# Patient Record
Sex: Male | Born: 1990 | Race: Black or African American | Hispanic: No | Marital: Single | Smoking: Current every day smoker
Health system: Southern US, Community
[De-identification: ages and names within clinical notes are randomized; demographics above are authoritative.]

---

## 2015-01-22 ENCOUNTER — Emergency Department (INDEPENDENT_AMBULATORY_CARE_PROVIDER_SITE_OTHER)
Admission: EM | Admit: 2015-01-22 | Discharge: 2015-01-22 | Disposition: A | Discharge status: Home / Self Care | Payer: Medicaid Other | Source: Home / Self Care | Attending: Emergency Medicine | Admitting: Emergency Medicine

## 2015-01-22 ENCOUNTER — Encounter (HOSPITAL_COMMUNITY): Payer: Self-pay | Admitting: Emergency Medicine

## 2015-01-22 DIAGNOSIS — B353 Tinea pedis: Secondary | ICD-10-CM

## 2015-01-22 DIAGNOSIS — M79674 Pain in right toe(s): Secondary | ICD-10-CM | POA: Diagnosis not present

## 2015-01-22 DIAGNOSIS — M79675 Pain in left toe(s): Secondary | ICD-10-CM | POA: Diagnosis not present

## 2015-01-22 DIAGNOSIS — L84 Corns and callosities: Secondary | ICD-10-CM | POA: Diagnosis not present

## 2015-01-22 MED ORDER — TOLNAFTATE 1 % EX AERO: INHALATION_SPRAY | CUTANEOUS | Status: DC

## 2015-01-22 MED ORDER — TERBINAFINE HCL 250 MG PO TABS: 250.0000 mg | ORAL_TABLET | Freq: Every day | ORAL | Status: DC

## 2015-01-22 NOTE — Discharge Instructions (Signed)
Corns and Calluses A thickening of the skin layer (usually over bony areas, such as toe joints) is known as a corn. Two types of corns exist: hard corns and soft corns. Calluses are painless areas of skin thickening that are caused by repeated pressure or irritation. Corns tend to affect toe joints and the skin between the toes; whereas, a callus can appear on any part of the body (especially the hands, feet, or knees).  SYMPTOMS   Corn:  Presence of a small (1/8 to 3/8 inch [3 to 10 mm in diameter]), painful bump on the side or over the joint of a toe.  Hard corns are more common on the outer portion of the little (fifth) toe at the joint.  Soft corns are more common between bony bumps (prominences), usually between the fourth and fifth toes or between the second and third toes.  Callus:  A rough, thickened area of skin that appears after repeated pressure or irritation. CAUSES  The purpose of corns and calluses is to protect an area of skin from injury caused by repeated irritation (rubbing or squeezing). The presence of pressure causes the skin cells to grow at a faster rate than the cells of unaffected areas. This leads to an overgrowth (corn or callus). As apposed to hard corns, soft corns tend to develop between toes, because there is more moisture. Soft corns are often the result of prolonged shoe wear, which leads to increased perspiration and moisture.  RISK INCREASES WITH:  Shoes that are too tight.  Occupations or sports that involve repetitive pressure on the hands (racquetball and baseball) or sudden stops on hard surfaces (track and tennis).  Sports that require the athlete to wear shoes, perspire, or wear clothing or protective gear that causes the production of heat and friction. PREVENTION  Properly fitted shoes and equipment.  Modify activities to prevent constant pressure on specific areas of skin.  If possible, wear padding over areas of skin that are exposed to  repeated pressure or irritation.  Keep the area between the toes dry (with powder or by removing shoes often).  Relieve shoe pressure by stretching the areas of the shoe that cause the pressure and or use ointments to soften leather shoes. PROGNOSIS  Corns and calluses typically subside if the activity that causes them is eliminated. Recovery may take up to 3 weeks. Recurrence is likely even with treatment if the cause is not removed.  RELATED COMPLICATIONS  If one overcompensates in an attempt to avoid pain, he or she may experience pain in other areas due to the changes in body movements (mechanics). TREATMENT  The best way to treat corns and calluses is to remove the source of pressure. Corn and callus pads may be helpful in reducing pressure on the affected skin. For soft corns, try to keep the affected area dry. If you cannot find shoes that fit properly, a shoe repair shop may be able to alter your shoes to reduce pressure. Occasionally a cushion for the bottom of the foot (metatarsal bar) worn within the shoe may relieve pressure on corns or calluses of the foot. For calluses, you may be able to peel or rub the thickened area with a pumice stone, sandstone, callus file, or with sandpaper to remove the callus; wetting the affected area may make this process more effective. Do not cut the corn or callus with a razor or knife. If the corn or callus must be removed, then a medically trained person should perform  the procedure. After peeling away the upper layers of a corn once or twice a day, it may be recommended to apply a non-prescription 5% to 10% salicylic ointment and cover the area with a bandage. It very uncommon to have the bony bumps (at toe joints) surgically removed. MEDICATION   If pain medication is necessary, nonsteroidal anti-inflammatory medications, such as aspirin and ibuprofen, or other minor pain relievers, such as acetaminophen, are often recommended. Contact your caregiver  immediately if any bleeding, stomach upset, or signs of an allergic reaction occur.  Topical salicylic ointments (5% to 10%) may be of benefit.  Prescription pain medications may be given by a caregiver. Use only as directed and only as much as you need.  Soak the foot for 20 minutes, twice a day, in a gallon of warm water. This may help to soften corns and calluses. Care should be taken to thoroughly dry the foot, especially between the toes, after soaking. SEEK MEDICAL CARE IF:   Symptoms get worse or do not improve in 2 weeks despite treatment.  Any signs of infection develop, including redness, swelling, increased pain or tenderness, or increased warmth around the corn or callus.  New, unexplained symptoms develop (drugs used in treatment may produce side effects). Document Released: 09/29/2005 Document Revised: 12/22/2011 Document Reviewed: 01/11/2009 [facility] Patient Information 2015 [place], [place]. This information is not intended to replace advice given to you by your health care provider. Make sure you discuss any questions you have with your health care provider.  Athlete's Foot Athlete's foot (tinea pedis) is a fungal infection of the skin on the feet. It often occurs on the skin between the toes or underneath the toes. It can also occur on the soles of the feet. Athlete's foot is more likely to occur in hot, humid weather. Not washing your feet or changing your socks often enough can contribute to athlete's foot. The infection can spread from person to person (contagious). CAUSES Athlete's foot is caused by a fungus. This fungus thrives in warm, moist places. Most people get athlete's foot by sharing shower stalls, towels, and wet floors with an infected person. People with weakened immune systems, including those with diabetes, may be more likely to get athlete's foot. SYMPTOMS   Itchy areas between the toes or on the soles of the feet.  White, flaky, or scaly areas between the  toes or on the soles of the feet.  Tiny, intensely itchy blisters between the toes or on the soles of the feet.  Tiny cuts on the skin. These cuts can develop a bacterial infection.  Thick or discolored toenails. DIAGNOSIS  Your caregiver can usually tell what the problem is by doing a physical exam. Your caregiver may also take a skin sample from the rash area. The skin sample may be examined under a microscope, or it may be tested to see if fungus will grow in the sample. A sample may also be taken from your toenail for testing. TREATMENT  Over-the-counter and prescription medicines can be used to kill the fungus. These medicines are available as powders or creams. Your caregiver can suggest medicines for you. Fungal infections respond slowly to treatment. You may need to continue using your medicine for several weeks. PREVENTION   Do not share towels.  Wear sandals in wet areas, such as shared locker rooms and shared showers.  Keep your feet dry. Wear shoes that allow air to circulate. Wear cotton or wool socks. HOME CARE INSTRUCTIONS   Take  medicines as directed by your caregiver. Do not use steroid creams on athlete's foot.  Keep your feet clean and cool. Wash your feet daily and dry them thoroughly, especially between your toes.  Change your socks every day. Wear cotton or wool socks. In hot climates, you may need to change your socks 2 to 3 times per day.  Wear sandals or canvas tennis shoes with good air circulation.  If you have blisters, soak your feet in Burow's solution or Epsom salts for 20 to 30 minutes, 2 times a day to dry out the blisters. Make sure you dry your feet thoroughly afterward. SEEK MEDICAL CARE IF:   You have a fever.  You have swelling, soreness, warmth, or redness in your foot.  You are not getting better after 7 days of treatment.  You are not completely cured after 30 days.  You have any problems caused by your medicines. MAKE SURE YOU:    Understand these instructions.  Will watch your condition.  Will get help right away if you are not doing well or get worse. Document Released: 09/26/2000 Document Revised: 12/22/2011 Document Reviewed: 07/18/2011 [facility] Patient Information 2015 [place], [place]. This information is not intended to replace advice given to you by your health care provider. Make sure you discuss any questions you have with your health care provider.

## 2015-01-22 NOTE — ED Provider Notes (Signed)
CSN: [id number]     Arrival date & time 01/22/15  1251 History   First MD Initiated Contact with Patient 01/22/15 1426     Chief Complaint  Patient presents with  . Toe Pain   (Consider location/radiation/quality/duration/timing/severity/associated sxs/prior Treatment) HPI     24 year old male presents for evaluation of toe pain. He has had pain on the outside of both fifth toes for about a month, getting worse in the past week. He has tried changing shoes but that has not helped. He denies any swelling or redness. Denies any injury.  History reviewed. No pertinent past medical history. History reviewed. No pertinent past surgical history. History reviewed. No pertinent family history. History  Substance Use Topics  . Smoking status: Current Every Day Smoker    Types: Cigarettes  . Smokeless tobacco: Not on file  . Alcohol Use: Yes    Review of Systems  Musculoskeletal:       Pain in the outside of the both toes  All other systems reviewed and are negative.   Allergies  Review of patient's allergies indicates no known allergies.  Home Medications   Prior to Admission medications   Medication Sig Start Date End Date Taking? Authorizing Provider  terbinafine (LAMISIL) 250 MG tablet Take 1 tablet (250 mg total) by mouth daily. 01/22/15   Adrian BlackwaterZachary H Nala Kachel, PA-C  Tolnaftate (TINACTIN) 1 % AERO Spray onto toes twice daily 01/22/15   Graylon GoodZachary H Javaun Dimperio, PA-C   BP 118/71 mmHg  Pulse 58  Temp(Src) 97.9 F (36.6 C) (Oral)  Resp 16  SpO2 100% Physical Exam  Constitutional: He is oriented to person, place, and time. He appears well-developed and well-nourished. No distress.  HENT:  Head: Normocephalic.  Pulmonary/Chest: Effort normal. No respiratory distress.  Neurological: He is alert and oriented to person, place, and time. Coordination normal.  Skin: Skin is warm and dry. No rash noted. He is not diaphoretic.  Skin between the fourth and fifth digits of the toes is thickened,  white, macerated; there is a thickened callus on the lateral portion of both fifth toes  Psychiatric: He has a normal mood and affect. Judgment normal.  Nursing note and vitals reviewed.   ED Course  Procedures (including critical care time) Labs Review Labs Reviewed - No data to display  Imaging Review No results found.   MDM   1. Toe pain, bilateral   2. Pre-ulcerative corn or callous   3. Tinea pedis of both feet    He has tinea pedis as well as corns. Use terbinafine and Tinactin spray as well as corn pads and follow-up with podiatry   Meds ordered this encounter  Medications  . terbinafine (LAMISIL) 250 MG tablet    Sig: Take 1 tablet (250 mg total) by mouth daily.    Dispense:  14 tablet    Refill:  0  . Tolnaftate (TINACTIN) 1 % AERO    Sig: Spray onto toes twice daily    Dispense:  1 Can    Refill:  0       Graylon GoodZachary H Sherrel Ploch, PA-C 01/22/15 1505

## 2015-01-22 NOTE — ED Notes (Signed)
Pt states that his toes on both feet hurt he is not sure what the cause of the pain is coming from.

## 2015-02-16 ENCOUNTER — Ambulatory Visit: Payer: Medicaid Other | Admitting: Podiatry

## 2015-04-09 ENCOUNTER — Ambulatory Visit (INDEPENDENT_AMBULATORY_CARE_PROVIDER_SITE_OTHER): Payer: Medicaid Other | Admitting: Family Medicine

## 2015-04-09 VITALS — BP 126/70 | HR 64 | Temp 98.2°F | Ht 71.5 in | Wt 143.2 lb

## 2015-04-09 DIAGNOSIS — L84 Corns and callosities: Secondary | ICD-10-CM | POA: Diagnosis not present

## 2015-04-09 DIAGNOSIS — Z008 Encounter for other general examination: Secondary | ICD-10-CM

## 2015-04-09 DIAGNOSIS — Z654 Victim of crime and terrorism: Secondary | ICD-10-CM

## 2015-04-09 DIAGNOSIS — K088 Other specified disorders of teeth and supporting structures: Secondary | ICD-10-CM | POA: Diagnosis present

## 2015-04-09 DIAGNOSIS — K089 Disorder of teeth and supporting structures, unspecified: Secondary | ICD-10-CM

## 2015-04-09 DIAGNOSIS — Z0289 Encounter for other administrative examinations: Secondary | ICD-10-CM

## 2015-04-09 NOTE — Assessment & Plan Note (Signed)
Patient is recent refugee to [place]. Reports issues that appear consistent with PTSD and drinking habits consistent with possible alcoholism. Will plan to discuss further at follow-u visit and evaluate with RHS-15 and CAGE questionairres.

## 2015-04-09 NOTE — Patient Instructions (Addendum)
Nice to meet you. We would like for you to obtain Dr Oletta CohnSchol's corn pads for your toes. Please apply these to your small toes in the area of thick skin. We will refer you to a dentist.  If you develop fever, worsening tooth pain, pain in your toes, redness, or drainage from toes please seek medical attention.
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
## 2015-04-09 NOTE — Assessment & Plan Note (Signed)
Patient with evidence of bilateral callus formation on 5th toes. No evidence of infection on exam. No pain today. Discussed obtaining Dr Oletta CohnSchol's corn pads from the pharmacy to help pad his toes. Also discussed different shoes with the patient. Given return precautions. Will see back in one month for follow-up.

## 2015-04-09 NOTE — Assessment & Plan Note (Signed)
Patient notes dental pain in areas where there are gaps in his teeth. Also has staining of inner aspect of teeth. No signs of infection today and no pain today. Will refer to dentist. Given return precautions.

## 2015-04-11 DIAGNOSIS — Z654 Victim of crime and terrorism: Secondary | ICD-10-CM | POA: Insufficient documentation

## 2015-05-08 ENCOUNTER — Ambulatory Visit: Payer: Medicaid Other | Admitting: Family Medicine

## 2015-07-26 ENCOUNTER — Emergency Department (HOSPITAL_COMMUNITY): Payer: BLUE CROSS/BLUE SHIELD

## 2015-07-26 ENCOUNTER — Emergency Department (HOSPITAL_COMMUNITY)
Admission: EM | Admit: 2015-07-26 | Discharge: 2015-07-26 | Disposition: A | Discharge status: Home / Self Care | Payer: BLUE CROSS/BLUE SHIELD | Attending: Emergency Medicine | Admitting: Emergency Medicine

## 2015-07-26 ENCOUNTER — Encounter (HOSPITAL_COMMUNITY): Payer: Self-pay | Admitting: Emergency Medicine

## 2015-07-26 DIAGNOSIS — Z79899 Other long term (current) drug therapy: Secondary | ICD-10-CM | POA: Insufficient documentation

## 2015-07-26 DIAGNOSIS — Z720 Tobacco use: Secondary | ICD-10-CM | POA: Insufficient documentation

## 2015-07-26 DIAGNOSIS — R519 Headache, unspecified: Secondary | ICD-10-CM

## 2015-07-26 DIAGNOSIS — R6883 Chills (without fever): Secondary | ICD-10-CM | POA: Insufficient documentation

## 2015-07-26 DIAGNOSIS — R51 Headache: Secondary | ICD-10-CM | POA: Diagnosis present

## 2015-07-26 MED ORDER — DIPHENHYDRAMINE HCL 50 MG/ML IJ SOLN
25.0000 mg | Freq: Once | INTRAMUSCULAR | Status: AC
  Administered 2015-07-26: 25 mg via INTRAVENOUS
  Filled 2015-07-26: qty 1

## 2015-07-26 MED ORDER — KETOROLAC TROMETHAMINE 30 MG/ML IJ SOLN
30.0000 mg | Freq: Once | INTRAMUSCULAR | Status: AC
  Administered 2015-07-26: 30 mg via INTRAVENOUS
  Filled 2015-07-26: qty 1

## 2015-07-26 MED ORDER — METOCLOPRAMIDE HCL 5 MG/ML IJ SOLN
10.0000 mg | Freq: Once | INTRAMUSCULAR | Status: AC
  Administered 2015-07-26: 10 mg via INTRAVENOUS
  Filled 2015-07-26: qty 2

## 2015-07-26 MED ORDER — SODIUM CHLORIDE 0.9 % IV BOLUS (SEPSIS)
1000.0000 mL | Freq: Once | INTRAVENOUS | Status: AC
  Administered 2015-07-26: 1000 mL via INTRAVENOUS

## 2015-07-26 NOTE — ED Notes (Signed)
Back from CT

## 2015-07-26 NOTE — ED Notes (Signed)
States his headache is better however he isn't able to rate pain

## 2015-07-26 NOTE — ED Provider Notes (Signed)
CSN: [id number]     Arrival date & time 07/26/15  5284 History   First MD Initiated Contact with Patient 07/26/15 [id number]     Chief Complaint  Patient presents with  . Headache     (Consider location/radiation/quality/duration/timing/severity/associated sxs/prior Treatment) Patient is a 24 y.o. male presenting with headaches. The history is provided by the patient.  Headache Pain location:  Frontal Quality:  Dull Radiates to:  Does not radiate Onset quality:  Gradual Timing:  Constant Progression:  Unchanged Chronicity:  New Relieved by:  Nothing Worsened by:  Nothing Associated symptoms: no fever     History reviewed. No pertinent past medical history. History reviewed. No pertinent past surgical history. No family history on file. Social History  Substance Use Topics  . Smoking status: Current Every Day Smoker    Types: Cigarettes  . Smokeless tobacco: None  . Alcohol Use: Yes    Review of Systems  Constitutional: Positive for chills. Negative for fever.  Neurological: Positive for headaches.  All other systems reviewed and are negative.     Allergies  Review of patient's allergies indicates no known allergies.  Home Medications   Prior to Admission medications   Medication Sig Start Date End Date Taking? Authorizing Provider  terbinafine (LAMISIL) 250 MG tablet Take 1 tablet (250 mg total) by mouth daily. 01/22/15   Graylon Good, PA-C  Tolnaftate (TINACTIN) 1 % AERO Spray onto toes twice daily 01/22/15   Graylon Good, PA-C   BP 129/63 mmHg  Pulse 69  Temp(Src) 98.1 F (36.7 C) (Oral)  Resp 14  Ht  (1.549 m)  Wt 140 lb (63.504 kg)  BMI 26.47 kg/m2  SpO2 100% Physical Exam  Constitutional: He is oriented to person, place, and time. He appears well-developed and well-nourished. No distress.  HENT:  Head: Normocephalic and atraumatic.  Mouth/Throat: Oropharynx is clear and moist. No oropharyngeal exudate.  Eyes: EOM are normal. Pupils are  equal, round, and reactive to light.  Neck: Normal range of motion. Neck supple.  Cardiovascular: Normal rate and regular rhythm.  Exam reveals no friction rub.   No murmur heard. Pulmonary/Chest: Effort normal and breath sounds normal. No respiratory distress. He has no wheezes. He has no rales.  Abdominal: Soft. He exhibits no distension. There is no tenderness. There is no rebound.  Musculoskeletal: Normal range of motion. He exhibits no edema.  Neurological: He is alert and oriented to person, place, and time. No cranial nerve deficit. He exhibits normal muscle tone. Coordination normal.  Skin: No rash noted. He is not diaphoretic.  Nursing note and vitals reviewed.   ED Course  Procedures (including critical care time) Labs Review Labs Reviewed - No data to display  Imaging Review Ct Head Wo Contrast  07/26/2015  CLINICAL DATA:  Headache starting yesterday EXAM: CT HEAD WITHOUT CONTRAST TECHNIQUE: Contiguous axial images were obtained from the base of the skull through the vertex without intravenous contrast. COMPARISON:  None. FINDINGS: No skull fracture is noted. Paranasal sinuses and mastoid air cells are unremarkable. No intracranial hemorrhage, mass effect or midline shift. No acute cortical infarction. No mass lesion is noted on this unenhanced scan. No hydrocephalus. The gray and white-matter differentiation is preserved. IMPRESSION: No acute intracranial abnormality. Electronically Signed   By: Natasha Mead M.D.   On: 07/26/2015 11:41   I have personally reviewed and evaluated these images and lab results as part of my medical decision-making.   EKG Interpretation None  MDM   Final diagnoses:  Acute nonintractable headache, unspecified headache type    23M here with headache and chills. Began last night. No history of headaches. No fever documents. Denies vision change, sore throat, rhinorrhea, ear pain, chest pain, SOB. No N/V. Headache diffuse, aching. Denies  myalgias, but feels diffusely weak.  Neuro exam benign, well-appearing, no concern for meningitis. Will give headache cocktail and scan his head. Toradol, reglan, benadryl given.  CT ok. Feeling better after medicines. Relaxing comfortably. No fever, well appearing, ambulating. He is stable for discharge.    Elwin MochaBlair Cayla Wiegand, MD 07/26/15 1556

## 2015-07-26 NOTE — Discharge Instructions (Signed)

## 2015-07-26 NOTE — ED Notes (Signed)
Onset one day ago while at work felt cold put a coat on and developed a headache. Pain currently 5/10 throbbing. Alert following commands appropriate.

## 2015-12-14 ENCOUNTER — Emergency Department (HOSPITAL_COMMUNITY)
Admission: EM | Admit: 2015-12-14 | Discharge: 2015-12-14 | Disposition: A | Discharge status: Home / Self Care | Payer: BLUE CROSS/BLUE SHIELD | Source: Home / Self Care | Attending: Family Medicine | Admitting: Family Medicine

## 2015-12-14 ENCOUNTER — Encounter (HOSPITAL_COMMUNITY): Payer: Self-pay | Admitting: Emergency Medicine

## 2015-12-14 DIAGNOSIS — J029 Acute pharyngitis, unspecified: Secondary | ICD-10-CM

## 2015-12-14 DIAGNOSIS — B349 Viral infection, unspecified: Secondary | ICD-10-CM

## 2015-12-14 DIAGNOSIS — R5381 Other malaise: Secondary | ICD-10-CM

## 2015-12-14 DIAGNOSIS — R5383 Other fatigue: Secondary | ICD-10-CM | POA: Diagnosis not present

## 2015-12-14 LAB — POCT INFECTIOUS MONO SCREEN: MONO SCREEN: NEGATIVE

## 2015-12-14 NOTE — ED Notes (Signed)
C/o cold sx onset yest associated w/fevers, prod cough, runny nose and congestion... No taking any meds... A&O x4... No acute distress.

## 2015-12-14 NOTE — Discharge Instructions (Signed)
It is a pleasure to see you today.  I believe your symptoms are caused by a viral respiratory illness.   I recommend taking Tylenol 650mg  tablets by mouth every 4 to 6 hours, alternating with ibuprofen 400-800mg  by mouth every 6-8 hours.   [facility]

## 2015-12-14 NOTE — ED Provider Notes (Addendum)
CSN: [id number]     Arrival date & time 12/14/15  1647 History   First MD Initiated Contact with Patient 12/14/15 1914     Chief Complaint  Patient presents with  . URI   (Consider location/radiation/quality/duration/timing/severity/associated sxs/prior Treatment) Patient is a 25 y.o. male presenting with URI. The history is provided by the patient. No language interpreter was used.  URI Presenting symptoms: fatigue, fever and sore throat   Presenting symptoms: no congestion, no cough and no rhinorrhea   Associated symptoms: no sneezing   Patient presents with complaint of sore throat, subjective fevers/chills and malaise, beginning yesterday evening at 7pm. Began with some nausea but no vomiting or diarrhea; his primary complaint has been a very bad sore throat. Denies cough or significant rhinorrhea.  Has not taken any medications for this episode of illness.   Patient received flu vaccine this season.  He is concerned about having the flu, because a close friend was diagnosed with flu earlier today.    Patient smokes 4-6 cigarettes a day.  Interested in quitting.   NKDA.  Takes no chronic medications.   History reviewed. No pertinent past medical history. History reviewed. No pertinent past surgical history. No family history on file. Social History  Substance Use Topics  . Smoking status: Current Every Day Smoker    Types: Cigarettes  . Smokeless tobacco: None  . Alcohol Use: Yes    Review of Systems  Constitutional: Positive for fever, chills, diaphoresis and fatigue.  HENT: Positive for sore throat. Negative for congestion, drooling, ear discharge, postnasal drip, rhinorrhea, sinus pressure and sneezing.   Respiratory: Negative for apnea, cough, chest tightness and shortness of breath.   Cardiovascular: Negative for chest pain.  Gastrointestinal: Positive for nausea. Negative for vomiting, abdominal pain and diarrhea.    Allergies  Review of patient's allergies  indicates no known allergies.  Home Medications   Prior to Admission medications   Medication Sig Start Date End Date Taking? Authorizing Provider  terbinafine (LAMISIL) 250 MG tablet Take 1 tablet (250 mg total) by mouth daily. 01/22/15   Adrian Blackwater Baker, PA-C  Tolnaftate (TINACTIN) 1 % AERO Spray onto toes twice daily 01/22/15   Graylon Good, PA-C   Meds Ordered and Administered this Visit  Medications - No data to display  BP 122/67 mmHg  Pulse 64  Temp(Src) 98 F (36.7 C) (Oral)  Resp 16  SpO2 100% No data found.   Physical Exam  Constitutional: He appears well-developed and well-nourished. No distress.  HENT:  Head: Normocephalic and atraumatic.  Right Ear: External ear normal.  Left Ear: External ear normal.  Nose: Nose normal.  Mouth/Throat: Oropharynx is clear and moist.  Injected nasal mucosa; No frontal or maxillary sinus tenderness Cobblestoning oropharynx without exudate.   Eyes: Conjunctivae and EOM are normal. Right eye exhibits no discharge. Left eye exhibits no discharge.  Neck: Normal range of motion. Neck supple. No thyromegaly present.  Some shotty anterior cervical adenopathy; no posterior cervical adenopathy.   Cardiovascular: Normal rate and regular rhythm.   Pulmonary/Chest: Effort normal and breath sounds normal. No respiratory distress. He has no wheezes. He has no rales. He exhibits no tenderness.  Abdominal: Soft. He exhibits no distension and no mass.  No splenomegaly noted on palpation  Lymphadenopathy:    He has cervical adenopathy.  Skin: He is not diaphoretic.    ED Course  Procedures (including critical care time)  Labs Review Labs Reviewed - No data to display  Imaging  Review No results found.   Visual Acuity Review  Right Eye Distance:   Left Eye Distance:   Bilateral Distance:    Right Eye Near:   Left Eye Near:    Bilateral Near:         MDM  No diagnosis found. Patient with pharyngitis, likely viral acute  etiology.  Consideration given to infectious mononucleosis; to check monospot test: negative.  Conservative supportive therapy, and return if worsening.   Paula ComptonJames Rmani Kapusta, MD    Barbaraann BarthelJames O Devonia Farro, MD 12/14/15 Serena Croissant1930  Barbaraann BarthelJames O Amala Petion, MD 12/14/15 [phone]

## 2016-04-14 ENCOUNTER — Encounter (HOSPITAL_COMMUNITY): Payer: Self-pay | Admitting: Emergency Medicine

## 2016-04-14 ENCOUNTER — Ambulatory Visit (HOSPITAL_COMMUNITY)
Admission: EM | Admit: 2016-04-14 | Discharge: 2016-04-14 | Disposition: A | Discharge status: Home / Self Care | Payer: BLUE CROSS/BLUE SHIELD | Attending: Family Medicine | Admitting: Family Medicine

## 2016-04-14 DIAGNOSIS — E860 Dehydration: Secondary | ICD-10-CM

## 2016-04-14 LAB — POCT I-STAT, CHEM 8
BUN: 5 mg/dL — ABNORMAL LOW (ref 6–20)
CREATININE: 0.8 mg/dL (ref 0.61–1.24)
Calcium, Ion: 1.17 mmol/L (ref 1.13–1.30)
Chloride: 100 mmol/L — ABNORMAL LOW (ref 101–111)
Glucose, Bld: 108 mg/dL — ABNORMAL HIGH (ref 65–99)
HCT: 57 % — ABNORMAL HIGH (ref 39.0–52.0)
Hemoglobin: 19.4 g/dL — ABNORMAL HIGH (ref 13.0–17.0)
Potassium: 3.7 mmol/L (ref 3.5–5.1)
SODIUM: 140 mmol/L (ref 135–145)
TCO2: 26 mmol/L (ref 0–100)

## 2016-04-14 LAB — POCT URINALYSIS DIP (DEVICE)
BILIRUBIN URINE: NEGATIVE
GLUCOSE, UA: NEGATIVE mg/dL
Hgb urine dipstick: NEGATIVE
Ketones, ur: NEGATIVE mg/dL
Leukocytes, UA: NEGATIVE
NITRITE: NEGATIVE
Protein, ur: >=300 mg/dL — AB
Specific Gravity, Urine: 1.015 (ref 1.005–1.030)
UROBILINOGEN UA: 1 mg/dL (ref 0.0–1.0)
pH: 8.5 — ABNORMAL HIGH (ref 5.0–8.0)

## 2016-04-14 LAB — POCT INFECTIOUS MONO SCREEN: Mono Screen: NEGATIVE

## 2016-04-14 MED ORDER — SODIUM CHLORIDE 0.9 % IV SOLN
Freq: Once | INTRAVENOUS | Status: AC
  Administered 2016-04-14: 19:00:00 via INTRAVENOUS

## 2016-04-14 NOTE — ED Provider Notes (Signed)
CSN: [id number]     Arrival date & time 04/14/16  1556 History   First MD Initiated Contact with Patient 04/14/16 1631     Chief Complaint  Patient presents with  . Headache  . Fatigue   (Consider location/radiation/quality/duration/timing/severity/associated sxs/prior Treatment) HPI History obtained from patient: Location:  Body, head Context/Duration: Patient states he started feeling weak with a headache yesterday. Symptoms got worse while working. Patient states that he has not been drinking a great deal of fluids.  Severity: No pain   Quality: Weakness Timing:        Constant    Home Treatment: None Associated symptoms: Weakness  Family History: None    History reviewed. No pertinent past medical history. History reviewed. No pertinent past surgical history. History reviewed. No pertinent family history. Social History  Substance Use Topics  . Smoking status: Current Every Day Smoker    Types: Cigarettes  . Smokeless tobacco: None  . Alcohol Use: Yes    Review of Systems  Denies:  NAUSEA, ABDOMINAL PAIN, CHEST PAIN, CONGESTION, DYSURIA, SHORTNESS OF BREATH  Allergies  Review of patient's allergies indicates no known allergies.  Home Medications   Prior to Admission medications   Medication Sig Start Date End Date Taking? Authorizing Provider  terbinafine (LAMISIL) 250 MG tablet Take 1 tablet (250 mg total) by mouth daily. 01/22/15   Adrian BlackwaterZachary H Baker, PA-C  Tolnaftate (TINACTIN) 1 % AERO Spray onto toes twice daily 01/22/15   Graylon GoodZachary H Baker, PA-C   Meds Ordered and Administered this Visit   Medications  0.9 %  sodium chloride infusion ( Intravenous New Bag/Given 04/14/16 1837)    BP 127/86 mmHg  Pulse 74  Temp(Src) 98.6 F (37 C) (Oral)  Resp 20  SpO2 100% No data found.   Physical Exam NURSES NOTES AND VITAL SIGNS REVIEWED. CONSTITUTIONAL: Well developed, well nourished, no acute distress HEENT: normocephalic, atraumatic EYES: Conjunctiva  normal NECK:normal ROM, supple, no adenopathy PULMONARY:No respiratory distress, normal effort ABDOMINAL: Soft, ND, NT BS+, No CVAT MUSCULOSKELETAL: Normal ROM of all extremities,  SKIN: warm and dry without rash PSYCHIATRIC: Mood and affect, behavior are normal  ED Course  Procedures (including critical care time)  Labs Review Labs Reviewed  POCT URINALYSIS DIP (DEVICE) - Abnormal; Notable for the following:    pH 8.5 (*)    Protein, ur >=300 (*)    All other components within normal limits  POCT I-STAT, CHEM 8 - Abnormal; Notable for the following:    Chloride 100 (*)    BUN 5 (*)    Glucose, Bld 108 (*)    Hemoglobin 19.4 (*)    HCT 57.0 (*)    All other components within normal limits  POCT INFECTIOUS MONO SCREEN   Laboratory data has been discussed with patient has used as part as the MDM Imaging Review No results found.   Visual Acuity Review  Right Eye Distance:   Left Eye Distance:   Bilateral Distance:    Right Eye Near:   Left Eye Near:    Bilateral Near:      Patient was given a liter of IV fluids and states he feels much better.   MDM   1. Dehydration     Patient is reassured that there are no issues that require transfer to higher level of care at this time or additional tests. Patient is advised to continue home symptomatic treatment. Patient is advised that if there are new or worsening symptoms to attend the emergency  department, contact primary care provider, or return to UC. Instructions of care provided discharged home in stable condition.    THIS NOTE WAS GENERATED USING A VOICE RECOGNITION SOFTWARE PROGRAM. ALL REASONABLE EFFORTS  WERE MADE TO PROOFREAD THIS DOCUMENT FOR ACCURACY.  I have verbally reviewed the discharge instructions with the patient. A printed AVS was given to the patient.  All questions were answered prior to discharge.      Tharon AquasFrank C Allard Lightsey, PA 04/14/16 [phone]

## 2016-04-14 NOTE — Discharge Instructions (Signed)

## 2016-04-14 NOTE — ED Notes (Signed)
[facility] with a [facility] of a headache and weakness that started 1 day ago.

## 2016-12-22 ENCOUNTER — Ambulatory Visit (HOSPITAL_COMMUNITY)
Admission: EM | Admit: 2016-12-22 | Discharge: 2016-12-22 | Disposition: A | Discharge status: Home / Self Care | Payer: BLUE CROSS/BLUE SHIELD | Attending: Emergency Medicine | Admitting: Emergency Medicine

## 2016-12-22 ENCOUNTER — Encounter (HOSPITAL_COMMUNITY): Payer: Self-pay | Admitting: *Deleted

## 2016-12-22 DIAGNOSIS — H9202 Otalgia, left ear: Secondary | ICD-10-CM

## 2016-12-22 NOTE — ED Provider Notes (Signed)
CSN: [id number]     Arrival date & time 12/22/16  1502 History   First MD Initiated Contact with Patient 12/22/16 1644     Chief Complaint  Patient presents with  . Otalgia   (Consider location/radiation/quality/duration/timing/severity/associated sxs/prior Treatment) 26 year old male presents to clinic with a 2 day history of left ear pain. Fever, congestion, he denies any loss of hearing, dizziness, tinnitus, cough, shortness of breath, URI type symptoms, chest pain or pressure, wheezing, nausea, vomiting, diarrhea, or abdominal pain. Has not taken any medications over-the-counter for his symptoms. He denies history of smoking, or recreational drug use.   The history is provided by the patient.  Otalgia    History reviewed. No pertinent past medical history. History reviewed. No pertinent surgical history. History reviewed. No pertinent family history. Social History  Substance Use Topics  . Smoking status: Current Every Day Smoker    Types: Cigarettes  . Smokeless tobacco: Not on file  . Alcohol use Yes    Review of Systems  Reason unable to perform ROS: As covered in history of present illness.  HENT: Positive for ear pain.   All other systems reviewed and are negative.   Allergies  Patient has no known allergies.  Home Medications   Prior to Admission medications   Not on File   Meds Ordered and Administered this Visit  Medications - No data to display  BP 110/72 (BP Location: Right Arm)   Pulse 72   Temp 98.6 F (37 C) (Oral)   Resp 18   SpO2 98%  No data found.   Physical Exam  Constitutional: He is oriented to person, place, and time. He appears well-developed and well-nourished. No distress.  HENT:  Head: Normocephalic and atraumatic.  Right Ear: Tympanic membrane and external ear normal.  Left Ear: Tympanic membrane and external ear normal.  Nose: Nose normal. Right sinus exhibits no maxillary sinus tenderness and no frontal sinus tenderness. Left  sinus exhibits no maxillary sinus tenderness and no frontal sinus tenderness.  Mouth/Throat: Uvula is midline and oropharynx is clear and moist. No oropharyngeal exudate.  Eyes: Conjunctivae are normal. Pupils are equal, round, and reactive to light.  Neck: Normal range of motion. Neck supple. No JVD present.  Cardiovascular: Normal rate and regular rhythm.   Pulmonary/Chest: Effort normal and breath sounds normal. No respiratory distress. He has no wheezes.  Abdominal: Soft. Bowel sounds are normal.  Musculoskeletal: He exhibits no edema or tenderness.  Lymphadenopathy:       Head (right side): No submental, no submandibular, no tonsillar and no preauricular adenopathy present.       Head (left side): No submental, no submandibular, no tonsillar and no preauricular adenopathy present.    He has no cervical adenopathy.  Neurological: He is alert and oriented to person, place, and time.  Skin: Skin is warm and dry. Capillary refill takes less than 2 seconds. No rash noted. He is not diaphoretic. No erythema.  Psychiatric: He has a normal mood and affect.  Nursing note and vitals reviewed.   Urgent Care Course     Procedures (including critical care time)  Labs Review Labs Reviewed - No data to display  Imaging Review No results found.     MDM   1. Otalgia of left ear    Your ear is not infected, and there is no trauma within the canal. I recommend taking a daily antihistamine such as Claritin, or Allegra, and using Flonase nasal spray 2 sprays each nostril once  a day. Should your pain persist I recommend you follow-up with your primary care provider or return to clinic as needed.      Dorena BodoLawrence Merwyn Hodapp, NP 12/22/16 1702

## 2016-12-22 NOTE — Discharge Instructions (Signed)
Your ear is not infected, and there is no trauma within the canal. I recommend taking a daily antihistamine such as Claritin, or Allegra, and using Flonase nasal spray 2 sprays each nostril once a day. Should your pain persist I recommend you follow-up with your primary care provider or return to clinic as needed.

## 2016-12-22 NOTE — ED Triage Notes (Signed)
Pt  Thinks  He  May  Have  A  Small  Sore  Inside  His  l ear  He  Reports  The  Pain  Started  Several  Days  Ago       denys  Any  Other  Symptoms   Sitting  Upright on the  Exam table  Speaking in  Complete  sentances  And  Is  In no  Acute  Distress

## 2017-01-22 ENCOUNTER — Ambulatory Visit (HOSPITAL_COMMUNITY)
Admission: EM | Admit: 2017-01-22 | Discharge: 2017-01-22 | Disposition: A | Discharge status: Home / Self Care | Payer: BLUE CROSS/BLUE SHIELD | Attending: Family Medicine | Admitting: Family Medicine

## 2017-01-22 ENCOUNTER — Encounter (HOSPITAL_COMMUNITY): Payer: Self-pay | Admitting: Emergency Medicine

## 2017-01-22 DIAGNOSIS — T700XXA Otitic barotrauma, initial encounter: Secondary | ICD-10-CM

## 2017-01-22 DIAGNOSIS — H9203 Otalgia, bilateral: Secondary | ICD-10-CM

## 2017-01-22 DIAGNOSIS — H6983 Other specified disorders of Eustachian tube, bilateral: Secondary | ICD-10-CM

## 2017-01-22 DIAGNOSIS — R0982 Postnasal drip: Secondary | ICD-10-CM | POA: Diagnosis not present

## 2017-01-22 NOTE — ED Provider Notes (Signed)
CSN: [id number]     Arrival date & time 01/22/17  1337 History   First MD Initiated Contact with Patient 01/22/17 1400     Chief Complaint  Patient presents with  . Otalgia   (Consider location/radiation/quality/duration/timing/severity/associated sxs/prior Treatment) Pleasant 26 year old African immigrant male complaining of bilateral ear pain left greater than right. Denies other symptoms except for the fact that sometimes he gets weak and experiencing the pain. Denies fever or chills.      History reviewed. No pertinent past medical history. History reviewed. No pertinent surgical history. History reviewed. No pertinent family history. Social History  Substance Use Topics  . Smoking status: Current Every Day Smoker    Types: Cigarettes  . Smokeless tobacco: Never Used  . Alcohol use Yes    Review of Systems  Constitutional: Negative for activity change, chills and fever.  HENT: Positive for ear pain, postnasal drip, rhinorrhea and sinus pain. Negative for sore throat and tinnitus.   Eyes: Negative.   Respiratory: Negative.   Cardiovascular: Negative.   Neurological: Negative.   All other systems reviewed and are negative.   Allergies  Patient has no known allergies.  Home Medications   Prior to Admission medications   Not on File   Meds Ordered and Administered this Visit  Medications - No data to display  BP 115/76 (BP Location: Left Arm)   Pulse 85   Temp 97.4 F (36.3 C) (Oral)   Resp 16   SpO2 98%  No data found.   Physical Exam  Constitutional: He is oriented to person, place, and time. He appears well-developed and well-nourished. No distress.  HENT:  Head: Normocephalic and atraumatic.  Mouth/Throat: No oropharyngeal exudate.  Bilateral TMs are pearly gray, translucent. No erythema or bulging. Both TMs have retraction.  Oropharynx with minor erythema and moderate amount of clear PND.  Eyes: EOM are normal.  Neck: Normal range of motion. Neck  supple.  Cardiovascular: Normal rate, regular rhythm and normal heart sounds.   Pulmonary/Chest: Effort normal and breath sounds normal.  Abdominal: Soft.  Musculoskeletal: Normal range of motion. He exhibits no edema.  Lymphadenopathy:    He has no cervical adenopathy.  Neurological: He is alert and oriented to person, place, and time.  Skin: Skin is warm and dry.  Psychiatric: He has a normal mood and affect.  Nursing note and vitals reviewed.   Urgent Care Course     Procedures (including critical care time)  Labs Review Labs Reviewed - No data to display  Imaging Review No results found.   Visual Acuity Review  Right Eye Distance:   Left Eye Distance:   Bilateral Distance:    Right Eye Near:   Left Eye Near:    Bilateral Near:         MDM   1. ETD (Eustachian tube dysfunction), bilateral   2. Barotitis media, initial encounter   3. Otalgia of both ears   4. PND (post-nasal drip)    You have drainage down the back of your throat which is likely affecting her eustachian tubes. The purpose for the tubes is to visualize pressuring her ear. Right now we are not functioning normally because of the drainage. This will get better. Medicines that can help or antihistamine such as Allegra or Zyrtec taken daily and sometimes Sudafed PE 10 mg every 4-6 hours. Drink plenty of cool liquids and keep your throat clear.     Hayden Rasmussen, NP 01/22/17 [phone]

## 2017-01-22 NOTE — Discharge Instructions (Signed)
You have drainage down the back of your throat which is likely affecting her eustachian tubes. The purpose for the tubes is to visualize pressuring her ear. Right now we are not functioning normally because of the drainage. This will get better. Medicines that can help or antihistamine such as Allegra or Zyrtec taken daily and sometimes Sudafed PE 10 mg every 4-6 hours. Drink plenty of cool liquids and keep your throat clear.

## 2017-01-22 NOTE — ED Triage Notes (Signed)
Here for intermittent bilateral ear pain onset 2 weeks associated w/weakness  Denies fevers, chills  A&O x4... NAD

## 2017-02-02 IMAGING — CT CT HEAD W/O CM
1 of 2 series · 16 of 30 positions shown, 20 images · non-contrast
Comparison: None.

CLINICAL DATA: Headache starting yesterday

EXAM:
CT HEAD WITHOUT CONTRAST
TECHNIQUE: Contiguous axial images were obtained from the base of the skull
through the vertex without intravenous contrast.

[Series 3: head 2.0 h70h · axial · 0.43mm/px · z∈[-108,+36]mm · 16 of 82 slices shown, 20 images]
[im 5/82  brain]
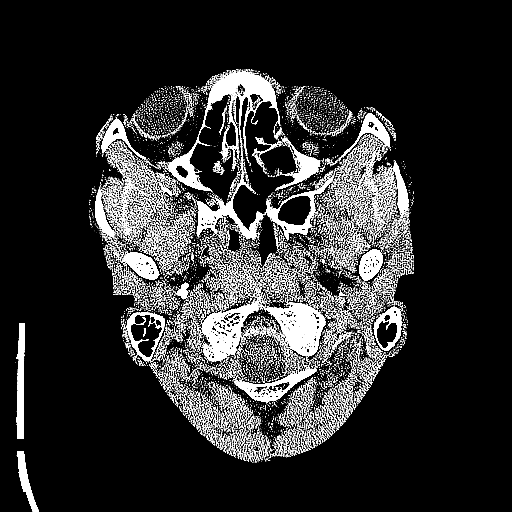
[im 5/82  bone]
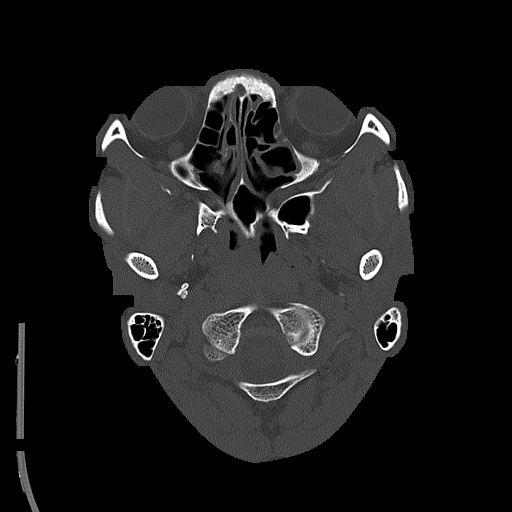
[im 9/82  brain]
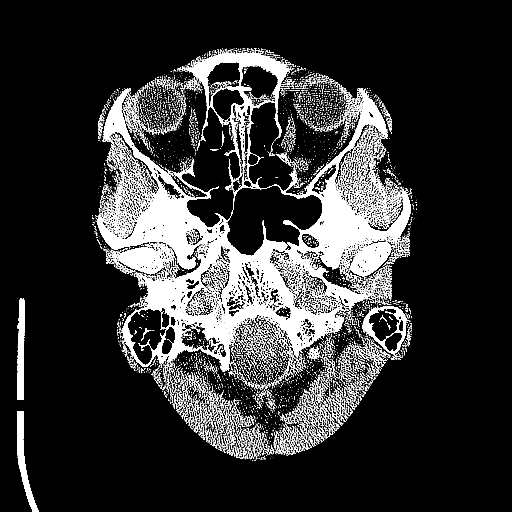
[im 13/82  brain]
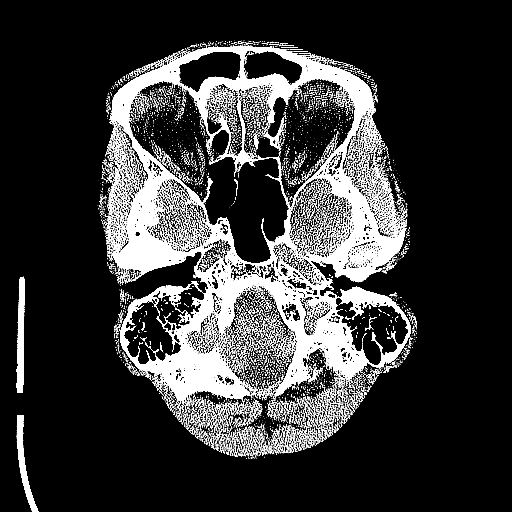
[im 21/82  brain]
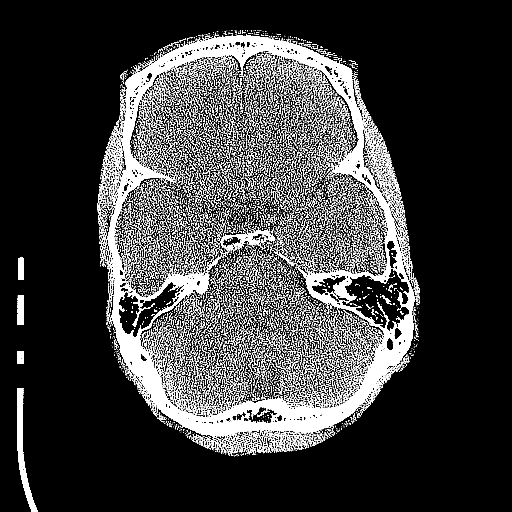
[im 25/82  brain]
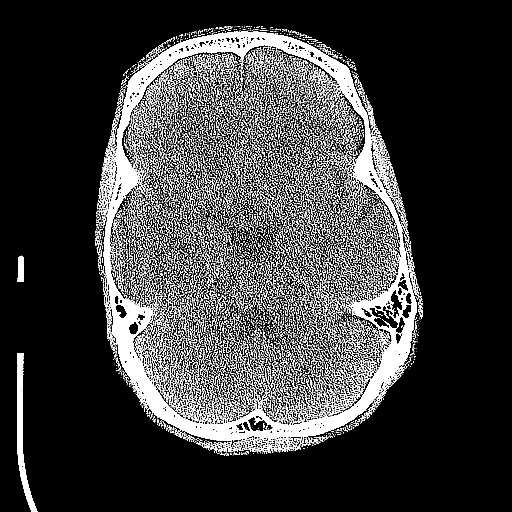
[im 25/82  bone]
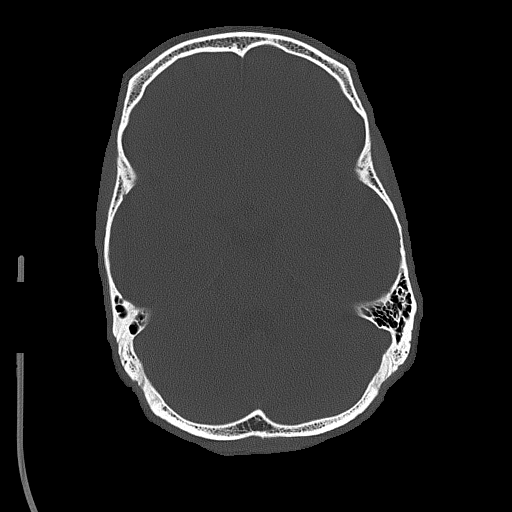
[im 29/82  brain]
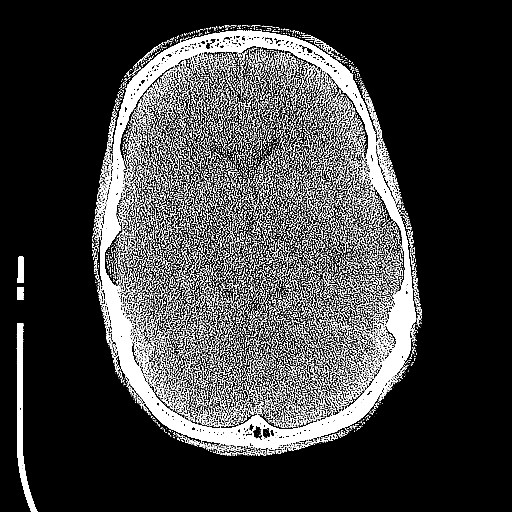
[im 33/82  brain]
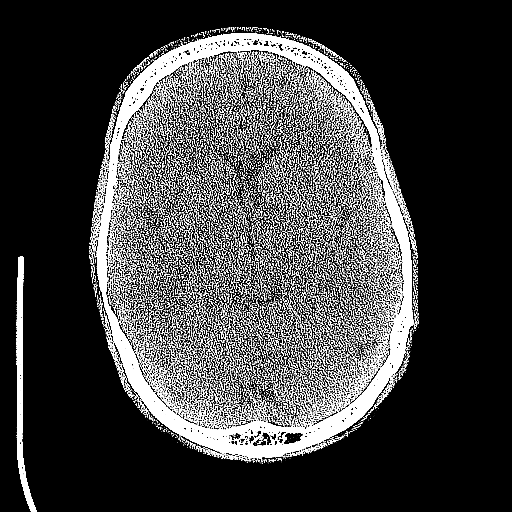
[im 37/82  brain]
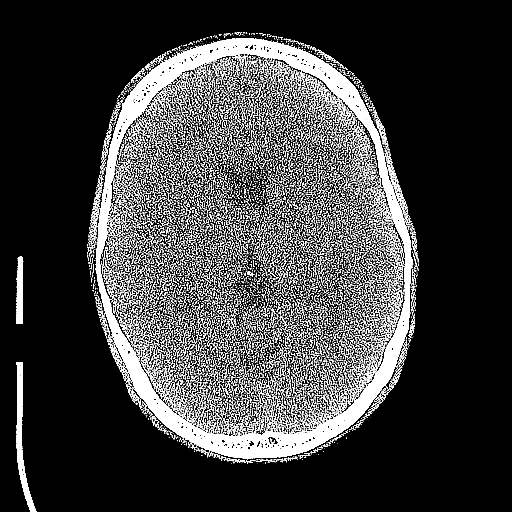
[im 45/82  brain]
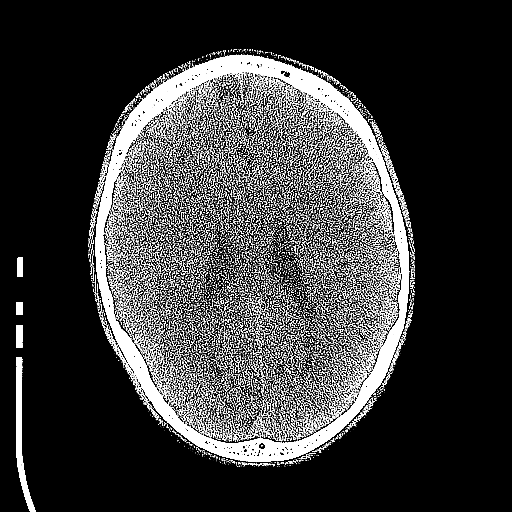
[im 45/82  bone]
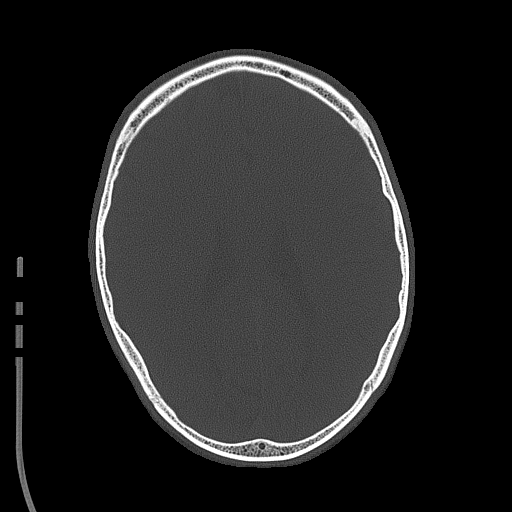
[im 49/82  brain]
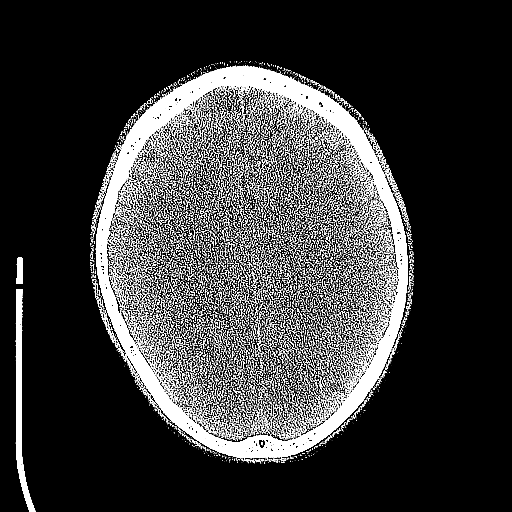
[im 53/82  brain]
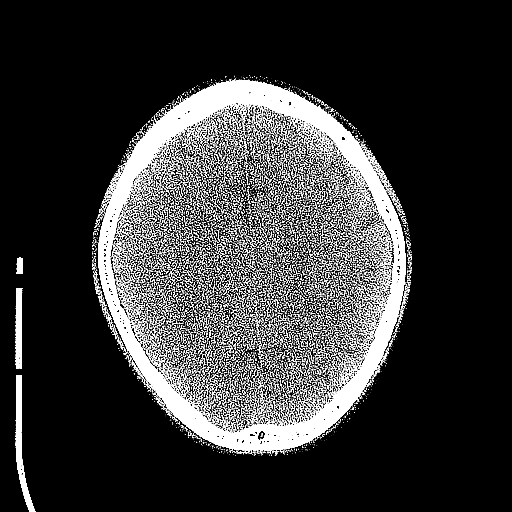
[im 57/82  brain]
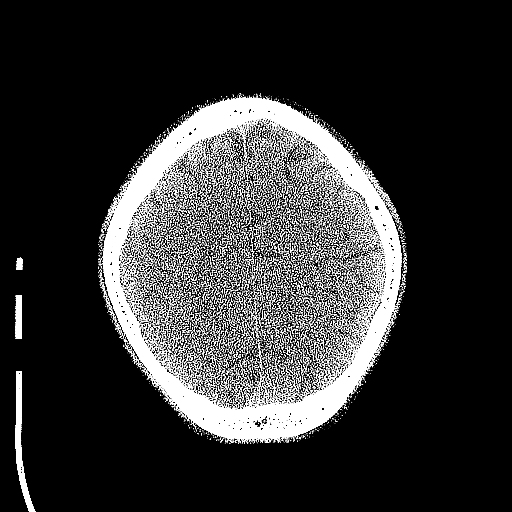
[im 61/82  brain]
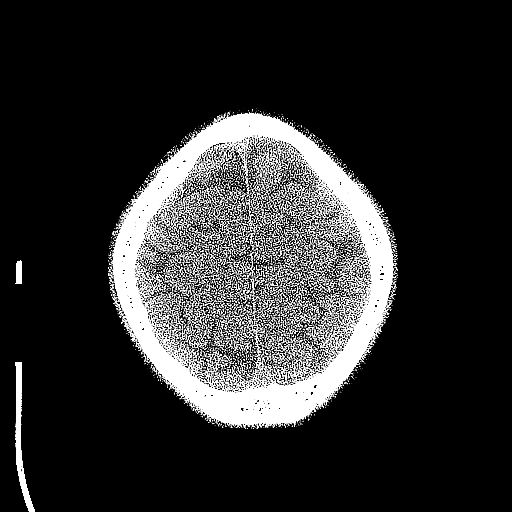
[im 61/82  bone]
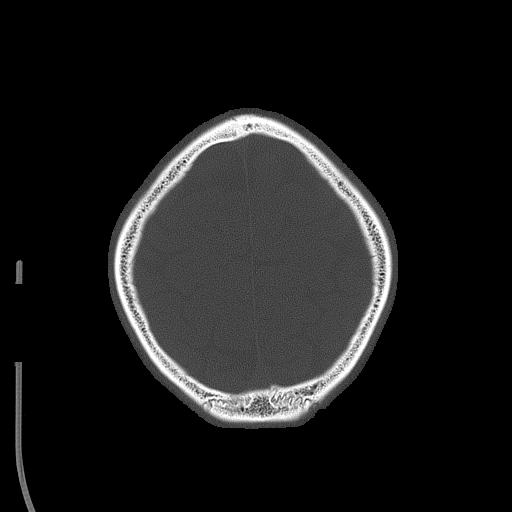
[im 69/82  brain]
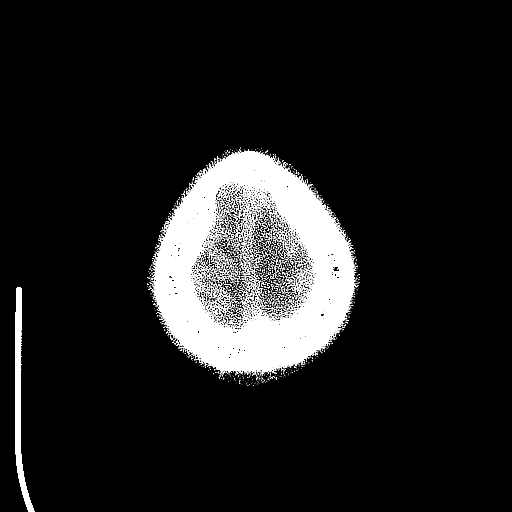
[im 73/82  brain]
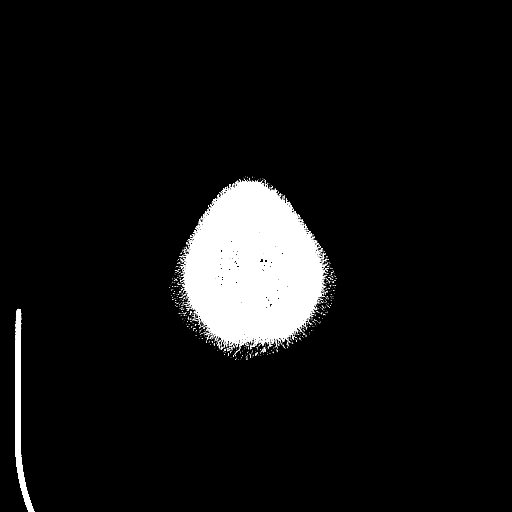
[im 77/82  brain]
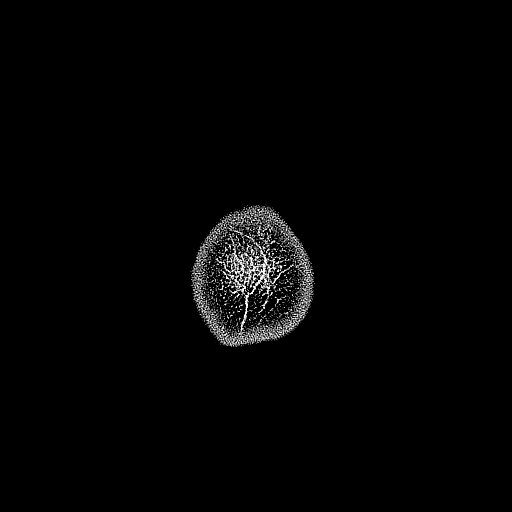

[16 of 30 positions shown; findings below may reference images not displayed]

FINDINGS: No skull fracture is noted. Paranasal sinuses and mastoid air cells
are unremarkable. No intracranial hemorrhage, mass effect or midline
shift. No acute cortical infarction. No mass lesion is noted on this
unenhanced scan. No hydrocephalus. The gray and white-matter
differentiation is preserved.
IMPRESSION: No acute intracranial abnormality.

## 2017-04-29 ENCOUNTER — Ambulatory Visit (HOSPITAL_COMMUNITY)
Admission: EM | Admit: 2017-04-29 | Discharge: 2017-04-29 | Disposition: A | Discharge status: Home / Self Care | Payer: BLUE CROSS/BLUE SHIELD | Attending: Internal Medicine | Admitting: Internal Medicine

## 2017-04-29 ENCOUNTER — Encounter (HOSPITAL_COMMUNITY): Payer: Self-pay | Admitting: Family Medicine

## 2017-04-29 DIAGNOSIS — R51 Headache: Secondary | ICD-10-CM | POA: Diagnosis not present

## 2017-04-29 DIAGNOSIS — R519 Headache, unspecified: Secondary | ICD-10-CM

## 2017-04-29 MED ORDER — CETIRIZINE-PSEUDOEPHEDRINE ER 5-120 MG PO TB12: 1.0000 | ORAL_TABLET | Freq: Every day | ORAL | 0 refills | Status: DC

## 2017-04-29 MED ORDER — IBUPROFEN 600 MG PO TABS: 600.0000 mg | ORAL_TABLET | Freq: Four times a day (QID) | ORAL | 0 refills | Status: AC | PRN

## 2017-04-29 NOTE — Discharge Instructions (Addendum)
Sinus congestion could be causing your ear pain and headache. Start zyrtec-d for sinus congestion/ear fullness. You can take ibuprofen as directed for your headache. Follow up with your PCP for further workup if your symptoms are not getting better. Monitor for symptoms such as, worsening headache, nausea, vomiting, weakness, lightheadedness, to follow up for re-evaluation.

## 2017-04-29 NOTE — ED Triage Notes (Signed)
Pt here for headache, fatigue and weakness since yesterday.

## 2017-04-29 NOTE — ED Provider Notes (Signed)
CSN: [id number]     Arrival date & time 04/29/17  1248 History   None    Chief Complaint  Patient presents with  . Headache  . Weakness   (Consider location/radiation/quality/duration/timing/severity/associated sxs/prior Treatment) 26 year old male comes in with one day history of headache, weakness, fatigue. He is not currently experiencing the headache. He has also been experiencing some left ear fullness. Has had some cough, but denies sneezing, sore throat, nasal congestion. He has not taken anything for the pain. He also states he feels tired, with soreness to his elbow joints. Headache is frontal, that is tight/stabbing pain. He denies fever, chills, night sweats. Denies abdominal pain, N/V/D. Denies syncope, lightheadedness. Denies photophobia, phonophobia.       History reviewed. No pertinent past medical history. History reviewed. No pertinent surgical history. History reviewed. No pertinent family history. Social History  Substance Use Topics  . Smoking status: Current Every Day Smoker    Types: Cigarettes  . Smokeless tobacco: Never Used  . Alcohol use Yes    Review of Systems  Constitutional: Negative for chills, diaphoresis and fever.  HENT: Positive for ear pain and sinus pressure. Negative for congestion, ear discharge, sore throat and trouble swallowing.   Eyes: Negative for pain, discharge and itching.  Respiratory: Positive for cough. Negative for shortness of breath and wheezing.   Cardiovascular: Negative for chest pain and palpitations.  Gastrointestinal: Negative for abdominal pain, nausea and vomiting.  Neurological: Positive for weakness and headaches. Negative for dizziness, tremors, syncope, light-headedness and numbness.    Allergies  Patient has no known allergies.  Home Medications   Prior to Admission medications   Medication Sig Start Date End Date Taking? Authorizing Provider  cetirizine-pseudoephedrine (ZYRTEC-D) 5-120 MG tablet Take 1  tablet by mouth daily. 04/29/17   Cathie HoopsYu, Isley Zinni V, PA-C  ibuprofen (ADVIL,MOTRIN) 600 MG tablet Take 1 tablet (600 mg total) by mouth every 6 (six) hours as needed. 04/29/17 05/09/17  Belinda FisherYu, Promiss Labarbera V, PA-C   Meds Ordered and Administered this Visit  Medications - No data to display  BP 123/72   Pulse 69   Temp 98.5 F (36.9 C)   Resp 18   SpO2 100%  No data found.   Physical Exam  Constitutional: He is oriented to person, place, and time. He appears well-developed and well-nourished. No distress.  HENT:  Head: Normocephalic and atraumatic.  Right Ear: Tympanic membrane, external ear and ear canal normal. Tympanic membrane is not erythematous and not bulging.  Left Ear: Tympanic membrane, external ear and ear canal normal. Tympanic membrane is not erythematous and not bulging.  Nose: Right sinus exhibits no maxillary sinus tenderness and no frontal sinus tenderness. Left sinus exhibits no maxillary sinus tenderness and no frontal sinus tenderness.  Mouth/Throat: Uvula is midline, oropharynx is clear and moist and mucous membranes are normal.  Eyes: Pupils are equal, round, and reactive to light. Conjunctivae are normal.  Neck: Normal range of motion. Neck supple.  Cardiovascular: Normal rate, regular rhythm and normal heart sounds.  Exam reveals no gallop and no friction rub.   No murmur heard. Pulmonary/Chest: Effort normal and breath sounds normal. He has no wheezes. He has no rales.  Lymphadenopathy:    He has no cervical adenopathy.  Neurological: He is alert and oriented to person, place, and time. No cranial nerve deficit.  Skin: Skin is warm and dry.  Psychiatric: He has a normal mood and affect. His behavior is normal. Judgment normal.  Urgent Care Course     Procedures (including critical care time)  Labs Review Labs Reviewed - No data to display  Imaging Review No results found.     MDM   1. Acute intractable headache, unspecified headache type    Discussed with  patient possible etiologies of his headache, including sinus pain, stress, migraine. As patient has not taken any medication for headache, to start with ibuprofen. Patient to start Zyrtec-D for sinus congestion/ear fullness. Follow up with PCP for further evaluation of headache as needed. Monitor for symptoms such as, worsening headache, nausea, vomiting, weakness, lightheadedness, to follow up for re-evaluation.     Belinda Fisher, PA-C 04/29/17 1818

## 2017-10-20 ENCOUNTER — Ambulatory Visit (HOSPITAL_COMMUNITY)
Admission: EM | Admit: 2017-10-20 | Discharge: 2017-10-20 | Disposition: A | Discharge status: Home / Self Care | Payer: BLUE CROSS/BLUE SHIELD | Attending: Internal Medicine | Admitting: Internal Medicine

## 2017-10-20 ENCOUNTER — Encounter (HOSPITAL_COMMUNITY): Payer: Self-pay | Admitting: Emergency Medicine

## 2017-10-20 DIAGNOSIS — L02412 Cutaneous abscess of left axilla: Secondary | ICD-10-CM | POA: Diagnosis not present

## 2017-10-20 MED ORDER — TRAMADOL HCL 50 MG PO TABS: 50.0000 mg | ORAL_TABLET | Freq: Four times a day (QID) | ORAL | 0 refills | Status: DC | PRN

## 2017-10-20 MED ORDER — SULFAMETHOXAZOLE-TRIMETHOPRIM 800-160 MG PO TABS: 1.0000 | ORAL_TABLET | Freq: Two times a day (BID) | ORAL | 0 refills | Status: DC

## 2017-10-20 NOTE — Discharge Instructions (Signed)
Please apply warm compresses to the left armpit as needed.  Take antibiotics as prescribed.  Take tramadol as needed for moderate to severe pain.  Take Tylenol and/or ibuprofen for mild pain.

## 2017-10-20 NOTE — ED Triage Notes (Signed)
PT reports boil under left arm for over 1 week.
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
## 2018-09-26 ENCOUNTER — Encounter (HOSPITAL_COMMUNITY): Payer: Self-pay | Admitting: Emergency Medicine

## 2018-09-26 ENCOUNTER — Ambulatory Visit (HOSPITAL_COMMUNITY)
Admission: EM | Admit: 2018-09-26 | Discharge: 2018-09-26 | Disposition: A | Discharge status: Home / Self Care | Payer: PRIVATE HEALTH INSURANCE | Attending: Family Medicine | Admitting: Family Medicine

## 2018-09-26 DIAGNOSIS — R300 Dysuria: Secondary | ICD-10-CM | POA: Diagnosis present

## 2018-09-26 DIAGNOSIS — Z79899 Other long term (current) drug therapy: Secondary | ICD-10-CM | POA: Insufficient documentation

## 2018-09-26 DIAGNOSIS — Z113 Encounter for screening for infections with a predominantly sexual mode of transmission: Secondary | ICD-10-CM | POA: Diagnosis not present

## 2018-09-26 DIAGNOSIS — F17210 Nicotine dependence, cigarettes, uncomplicated: Secondary | ICD-10-CM | POA: Diagnosis not present

## 2018-09-26 DIAGNOSIS — Z202 Contact with and (suspected) exposure to infections with a predominantly sexual mode of transmission: Secondary | ICD-10-CM | POA: Diagnosis not present

## 2018-09-26 MED ORDER — CEFTRIAXONE SODIUM 250 MG IJ SOLR
INTRAMUSCULAR | Status: AC
Start: 2018-09-26 — End: 2018-09-26
  Filled 2018-09-26: qty 250

## 2018-09-26 MED ORDER — AZITHROMYCIN 250 MG PO TABS
1000.0000 mg | ORAL_TABLET | Freq: Once | ORAL | Status: AC
  Administered 2018-09-26: 1000 mg via ORAL

## 2018-09-26 MED ORDER — AZITHROMYCIN 250 MG PO TABS
ORAL_TABLET | ORAL | Status: AC
  Filled 2018-09-26: qty 4

## 2018-09-26 MED ORDER — CEFTRIAXONE SODIUM 250 MG IJ SOLR
250.0000 mg | Freq: Once | INTRAMUSCULAR | Status: AC
  Administered 2018-09-26: 250 mg via INTRAMUSCULAR

## 2018-09-26 MED ORDER — LIDOCAINE HCL (PF) 1 % IJ SOLN
INTRAMUSCULAR | Status: AC
  Filled 2018-09-26: qty 2

## 2018-09-26 NOTE — ED Triage Notes (Signed)
Pt here for dysuria x 1 week 

## 2018-09-26 NOTE — Discharge Instructions (Addendum)
Urine sent for STD testing We are treating you today for possibility of gonorrhea chlamydia infection We also did blood work for HIV and syphilis Lab results pending we will call you with any positive results

## 2018-09-27 LAB — HIV ANTIBODY (ROUTINE TESTING W REFLEX): HIV SCREEN 4TH GENERATION: NONREACTIVE

## 2018-09-27 LAB — RPR: RPR: NONREACTIVE

## 2018-09-27 LAB — URINE CYTOLOGY ANCILLARY ONLY
Chlamydia: NEGATIVE
NEISSERIA GONORRHEA: NEGATIVE
Trichomonas: NEGATIVE
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
## 2020-04-19 ENCOUNTER — Other Ambulatory Visit: Payer: Self-pay

## 2020-04-19 ENCOUNTER — Ambulatory Visit (HOSPITAL_COMMUNITY)
Admission: EM | Admit: 2020-04-19 | Discharge: 2020-04-19 | Disposition: A | Discharge status: Home / Self Care | Payer: PRIVATE HEALTH INSURANCE | Attending: Urgent Care | Admitting: Urgent Care

## 2020-04-19 ENCOUNTER — Encounter (HOSPITAL_COMMUNITY): Payer: Self-pay

## 2020-04-19 DIAGNOSIS — H6690 Otitis media, unspecified, unspecified ear: Secondary | ICD-10-CM

## 2020-04-19 DIAGNOSIS — H9201 Otalgia, right ear: Secondary | ICD-10-CM | POA: Diagnosis not present

## 2020-04-19 MED ORDER — NAPROXEN 500 MG PO TABS: 500.0000 mg | ORAL_TABLET | Freq: Two times a day (BID) | ORAL | 0 refills | Status: DC

## 2020-04-19 MED ORDER — AMOXICILLIN 875 MG PO TABS: 875.0000 mg | ORAL_TABLET | Freq: Two times a day (BID) | ORAL | 0 refills | Status: DC

## 2020-04-19 NOTE — ED Provider Notes (Signed)
[facility]   MRN: [id number] DOB: 07/11/1991  Subjective:   Angel Oliver is a 29 y.o. male presenting for 2 day hx of worsening right ear pain, right sided h/a, fatigue, red eyes. Denies COVID vaccination. Does not want testing. Denies fever, cough, cp, shob.  No current facility-administered medications for this encounter. No current outpatient medications on file.   No Known Allergies  History reviewed. No pertinent past medical history.   History reviewed. No pertinent surgical history.  No family history on file.  Social History   Tobacco Use  . Smoking status: Current Every Day Smoker    Packs/day: 0.25    Types: Cigarettes  . Smokeless tobacco: Never Used  Substance Use Topics  . Alcohol use: Yes    Alcohol/week: 4.0 standard drinks    Types: 4 Cans of beer per week  . Drug use: No    ROS   Objective:   Vitals: BP 129/74   Pulse 61   Temp 98.7 F (37.1 C) (Oral)   Resp 16   Ht 5\' 10"  (1.778 m)   Wt 120 lb (54.4 kg)   SpO2 99%   BMI 17.22 kg/m   Physical Exam Constitutional:      General: He is not in acute distress.    Appearance: Normal appearance. He is normal weight. He is not ill-appearing.  HENT:     Head: Normocephalic and atraumatic.     Right Ear: Ear canal and external ear normal. There is no impacted cerumen.     Left Ear: Tympanic membrane, ear canal and external ear normal. There is no impacted cerumen.     Ears:     Comments: Right TM erythematous, bulging.    Nose: Nose normal. No congestion or rhinorrhea.     Mouth/Throat:     Mouth: Mucous membranes are moist.     Pharynx: Oropharynx is clear. No oropharyngeal exudate or posterior oropharyngeal erythema.  Eyes:     General: No scleral icterus.       Right eye: No discharge.        Left eye: No discharge.     Extraocular Movements: Extraocular movements intact.     Conjunctiva/sclera: Conjunctivae normal.     Pupils: Pupils are equal, round, and reactive to  light.  Cardiovascular:     Rate and Rhythm: Normal rate.  Pulmonary:     Effort: Pulmonary effort is normal.  Musculoskeletal:     Cervical back: Normal range of motion and neck supple. No rigidity. No muscular tenderness.  Neurological:     General: No focal deficit present.     Mental Status: He is alert and oriented to person, place, and time.     Cranial Nerves: No cranial nerve deficit.     Motor: No weakness.     Coordination: Coordination normal.     Gait: Gait normal.     Deep Tendon Reflexes: Reflexes normal.  Psychiatric:        Mood and Affect: Mood normal.        Behavior: Behavior normal.       Assessment and Plan :   PDMP not reviewed this encounter.  1. Right ear pain   2. Acute otitis media, unspecified otitis media type     Start amoxicillin to cover for otitis media. Use supportive care otherwise. Counseled patient on potential for adverse effects with medications prescribed/recommended today, ER and return-to-clinic precautions discussed, patient verbalized understanding.    , PA-C 04/20/20 1251

## 2020-04-19 NOTE — ED Triage Notes (Signed)
Pt c/o HA, fatigue, red eyes bilat, pain in right earx2 days.

## 2020-08-29 ENCOUNTER — Ambulatory Visit (HOSPITAL_COMMUNITY)
Admission: EM | Admit: 2020-08-29 | Discharge: 2020-08-29 | Disposition: A | Discharge status: Home / Self Care | Payer: PRIVATE HEALTH INSURANCE | Attending: Emergency Medicine | Admitting: Emergency Medicine

## 2020-08-29 ENCOUNTER — Other Ambulatory Visit: Payer: Self-pay

## 2020-08-29 ENCOUNTER — Encounter (HOSPITAL_COMMUNITY): Payer: Self-pay | Admitting: *Deleted

## 2020-08-29 DIAGNOSIS — R519 Headache, unspecified: Secondary | ICD-10-CM | POA: Diagnosis present

## 2020-08-29 DIAGNOSIS — B349 Viral infection, unspecified: Secondary | ICD-10-CM

## 2020-08-29 DIAGNOSIS — Z20822 Contact with and (suspected) exposure to covid-19: Secondary | ICD-10-CM | POA: Diagnosis present

## 2020-08-29 MED ORDER — IBUPROFEN 600 MG PO TABS: 600.0000 mg | ORAL_TABLET | Freq: Four times a day (QID) | ORAL | 0 refills | Status: DC | PRN

## 2020-08-29 NOTE — Discharge Instructions (Addendum)
Your Covid and flu will be back tomorrow.  You can go back to work if your Covid and flu tests are negative.  Take 600 mg of ibuprofen combined with 1000 mg of Tylenol together 3-4 times a day as needed for headache.  warm compresses to your neck/warm showers.  With a primary care physician of your choice for routine care.  Go to the ER for the signs and symptoms we discussed.  Below is a list of primary care practices who are taking new patients for you to follow-up with.  [facility] internal medicine clinic Ground Floor - [facility], [address] [place], [place], [place] [address] [phone]  [facility] at [facility] [address] [place], [place] [address] [phone]  [facility] [address], [place] [address] [phone]  [facility] [phone] [address] [place] [address]  [facility]: [address] [place] [address]  [phone]  [facility]: [address] [place] [address]   [phone]  [facility]: [address] [place] [address]  [phone]  [facility] : [address] [place] [address] [phone]  [facility]: [address] [place] [address] [phone]  [facility]: [address] [place] [address] [phone]  Dr. Oneal Grout [address] [facility] [place] [address]  [phone]  Dr. Jackie Plum, [facility]. [address] [place], [place] [address]  [phone]  Go to www.goodrx.com to look up your medications. This will give you a list of where you can find your prescriptions at the most affordable prices. Or ask the pharmacist  what the cash price is, or if they have any other discount programs available to help make your medication more affordable. This can be less expensive than what you would pay with insurance.

## 2020-08-29 NOTE — ED Provider Notes (Signed)
HPI  SUBJECTIVE:  Angel Oliver is a 29 y.o. male who reports gradual onset headaches located in the top of his head lasting about 2 to 3 hours starting yesterday.  Questionable chills, fatigue.  He had a mild sore throat 3 days ago, but this has resolved.  No body aches, fevers, ear pain, nasal congestion, rhinorrhea, sinus pain or pressure, loss of sense of smell or taste, cough, shortness of breath, nausea, vomiting, diarrhea, abdominal pain.  No rash, neck stiffness, photophobia, dental pain, arm or leg weakness, facial droop, discoordination, dizziness, slurred speech.  No allergy symptoms.  No seizures, syncope.  This is not the first or worst headache of bis life.  No aggravating or alleviating factors.  He has not tried anything for this.  No known Covid or flu exposure.  He has not yet gotten the Covid or flu vaccines.  He has had headaches like this before which was thought to be due to a viral illness.  No antibiotics in the past month.  No antipyretic in the past 6 hours.  Past medical history negative for diabetes, hypertension, seizures, stroke, aneurysm, pulmonary disease, chronic kidney disease, coronary disease, atrial fibrillation.  PMD: None.  History reviewed. No pertinent past medical history.  History reviewed. No pertinent surgical history.  History reviewed. No pertinent family history.  Social History   Tobacco Use  . Smoking status: Current Every Day Smoker    Packs/day: 0.25    Types: Cigarettes  . Smokeless tobacco: Never Used  Substance Use Topics  . Alcohol use: Yes    Alcohol/week: 4.0 standard drinks    Types: 4 Cans of beer per week  . Drug use: No    No current facility-administered medications for this encounter.  Current Outpatient Medications:  .  ibuprofen (ADVIL) 600 MG tablet, Take 1 tablet (600 mg total) by mouth every 6 (six) hours as needed., Disp: 30 tablet, Rfl: 0  No Known Allergies   ROS  As noted in HPI.   Physical Exam  BP  130/82 (BP Location: Right Arm)   Pulse 64   Temp 98.4 F (36.9 C) (Oral)   Resp 16   Ht 5\' 10"  (1.778 m)   Wt 72.6 kg   BMI 22.96 kg/m   Constitutional: Well developed, well nourished, no acute distress Eyes: PERRL, EOMI, conjunctiva normal bilaterally.  No direct or consensual photophobia HENT: Normocephalic, atraumatic,mucus membranes moist, normal dentition.  TM normal b/l. No nasal congestion, no sinus tenderness. No temporal artery tenderness.  Neck: no cervical LN + bilateral trapezial muscle tenderness. No meningismus Respiratory: normal inspiratory effort, lungs clear bilaterally Cardiovascular: Normal rate, regular rhythm GI:  nondistended skin: No rash, skin intact Musculoskeletal: No edema, no tenderness, no deformities Neurologic: Alert & oriented x 3, CN III-XII intact, coordination, motor and sensation grossly intact. Psychiatric: Speech and behavior appropriate   ED Course   Medications - No data to display  Orders Placed This Encounter  Procedures  . Respiratory Panel by RT PCR (Flu A&B, Covid) - Nasopharyngeal Swab    Standing Status:   Standing    Number of Occurrences:   1    Order Specific Question:   Is this test for diagnosis or screening    Answer:   Diagnosis of ill patient    Order Specific Question:   Symptomatic for COVID-19 as defined by CDC    Answer:   Yes    Order Specific Question:   Date of Symptom Onset  Answer:   08/28/2020    Order Specific Question:   Hospitalized for COVID-19    Answer:   No    Order Specific Question:   Admitted to ICU for COVID-19    Answer:   No    Order Specific Question:   Previously tested for COVID-19    Answer:   No    Order Specific Question:   Resident in a congregate (group) care setting    Answer:   No    Order Specific Question:   Employed in healthcare setting    Answer:   No    Order Specific Question:   Has patient completed COVID vaccination(s) (2 doses of Pfizer/Moderna 1 dose of [employer])    Answer:   No   No results found for this or any previous visit (from the past 24 hour(s)). No results found.   ED Clinical Impression  1. Viral illness   2. Acute nonintractable headache, unspecified headache type   3. Encounter for laboratory testing for COVID-19 virus     ED Assessment/Plan  Pt describing typical pain, no sudden onset. Doubt SAH, ICH or space occupying lesion. Pt without fevers, Pt has no meningeal sx, no nuchal rigidity. Doubt meningitis. Pt with normal neuro exam, no evidence of CVA/TIA.  Pt BP not elevated significantly, doubt hypertensive emergency. No evidence of temporal artery tenderness, no evidence of glaucoma or other ocular pathology.  No evidence of sinusitis, otitis, dental infection.  Presentation consistent with viral illness.  He has trapezial tenderness which could be contributing to his headache.  Covid, flu sent.  Home with Tylenol/ibuprofen, warm compresses.  Primary care list for routine care.  Work note for tomorrow.  Covid, flu pending at the time of this note.  Meds ordered this encounter  Medications  . ibuprofen (ADVIL) 600 MG tablet    Sig: Take 1 tablet (600 mg total) by mouth every 6 (six) hours as needed.    Dispense:  30 tablet    Refill:  0    *This clinic note was created using Scientist, clinical (histocompatibility and immunogenetics). Therefore, there may be occasional mistakes despite careful proofreading.  ?    Domenick Gong, MD 08/30/20 [phone]

## 2020-08-29 NOTE — ED Triage Notes (Signed)
PT reports he had a HA and had to leave work because of pain. Pt also had a sore throat 3 days ago.

## 2020-08-30 LAB — RESPIRATORY PANEL BY RT PCR (FLU A&B, COVID)
Influenza A by PCR: NEGATIVE
Influenza B by PCR: NEGATIVE
SARS Coronavirus 2 by RT PCR: NEGATIVE

## 2021-03-01 ENCOUNTER — Emergency Department (HOSPITAL_COMMUNITY)
Admission: EM | Admit: 2021-03-01 | Discharge: 2021-03-02 | Disposition: A | Discharge status: Home / Self Care | Payer: PRIVATE HEALTH INSURANCE | Attending: Emergency Medicine | Admitting: Emergency Medicine

## 2021-03-01 ENCOUNTER — Other Ambulatory Visit: Payer: Self-pay

## 2021-03-01 ENCOUNTER — Encounter (HOSPITAL_COMMUNITY): Payer: Self-pay | Admitting: Student

## 2021-03-01 DIAGNOSIS — F1099 Alcohol use, unspecified with unspecified alcohol-induced disorder: Secondary | ICD-10-CM | POA: Insufficient documentation

## 2021-03-01 DIAGNOSIS — F17210 Nicotine dependence, cigarettes, uncomplicated: Secondary | ICD-10-CM | POA: Insufficient documentation

## 2021-03-01 DIAGNOSIS — R519 Headache, unspecified: Secondary | ICD-10-CM | POA: Diagnosis not present

## 2021-03-01 DIAGNOSIS — Z789 Other specified health status: Secondary | ICD-10-CM

## 2021-03-01 MED ORDER — ACETAMINOPHEN 325 MG PO TABS
650.0000 mg | ORAL_TABLET | Freq: Once | ORAL | Status: AC
  Administered 2021-03-01: 650 mg via ORAL
  Filled 2021-03-01: qty 2

## 2021-03-01 NOTE — ED Triage Notes (Signed)
Patient reports headache starting about 3 pm tonight, frontal headache, denies vision changes, states hx of headache

## 2021-03-01 NOTE — ED Provider Notes (Signed)
  Emergency Medicine Provider in Triage Note   MSE was initiated and I personally evaluated the patient  11:21 PM on Mar 01, 2021 as provider in triage.   Chief Complaint: headache  HPI  Patient is a 30 y.o. who presents to the ED with complaints of gradual onset waxing/waning frontal headache since 1500. No alleviating/aggravating factors. No intervention PTA. Hx of similar headaches. No recent head injuries.    Review of Systems  Positive: headache Negative: Numbness, weakness, visual disturbance, chest pain, dyspnea, congestion, sore throat, cough, ear pain.   Physical Exam  BP 130/72   Pulse 72   Temp 98.5 F (36.9 C) (Oral)   Resp 17   Ht 5\' 10"  (1.778 m)   Wt 73.5 kg   SpO2 98%   BMI 23.24 kg/m    Gen:   Awake, no distress   HEENT:  Atraumatic. No nuchal rigidity.  Resp:  Normal effort  Cardiac:  Normal rate  Abd:   Nondistended, nontender  MSK:   Moves extremities without difficulty  Neuro:  Clear speech. Cn III-XII grossly intact. Sensation grossly intact x 4. 5/5 symmetric grip strength. Normal finger to nose. Negative pronator drift. Ambulatory.   Medical Decision Making   Initiation of care has begun. The patient has been counseled on the process, plan, and necessity for staying for the completion/evaluation, informed that the remainder of the evaluation will be completed by another provider, this initial triage assessment does not replace that evaluation, and the importance of remaining in the ED until their evaluation is complete.  Headache, hx of similar, no fever/nuchal rigidity, no neuro deficits. Tylenol ordered  Clinical Impression  Headache.         03/01/21 2323    03/03/21, MD 03/01/21 2337

## 2021-03-02 MED ORDER — BUTALBITAL-APAP-CAFFEINE 50-325-40 MG PO TABS: 1.0000 | ORAL_TABLET | Freq: Four times a day (QID) | ORAL | 0 refills | Status: AC | PRN
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
## 2021-03-02 NOTE — Discharge Instructions (Addendum)
You were seen in the emergency department for a headache  The most common causes of headache are dehydration, poor sleep, stress.  Consider cutting back on alcohol use and tobacco as well as these can exacerbate headaches  For mild or moderate headache take ibuprofen or acetaminophen every 8 hours as needed for headache.  If you have to take ibuprofen or acetaminophen more than 2-3 times in 1 day, take Fioricet.  If her headaches continue and you have to constantly take medicines call neurologist/headache and brain specialist for an appointment  Return to the ED for sudden or severe headache, fever, vomiting, stiffness in your neck, visual changes, severe dizziness, stroke symptoms like facial drooping or one-sided weakness or numbness

## 2021-08-05 ENCOUNTER — Other Ambulatory Visit: Payer: Self-pay

## 2021-08-05 ENCOUNTER — Encounter (HOSPITAL_COMMUNITY): Payer: Self-pay | Admitting: Emergency Medicine

## 2021-08-05 ENCOUNTER — Ambulatory Visit (HOSPITAL_COMMUNITY)
Admission: EM | Admit: 2021-08-05 | Discharge: 2021-08-05 | Disposition: A | Discharge status: Home / Self Care | Payer: No Typology Code available for payment source | Attending: Emergency Medicine | Admitting: Emergency Medicine

## 2021-08-05 DIAGNOSIS — K2900 Acute gastritis without bleeding: Secondary | ICD-10-CM

## 2021-08-05 MED ORDER — ONDANSETRON 4 MG PO TBDP: 4.0000 mg | ORAL_TABLET | Freq: Three times a day (TID) | ORAL | 0 refills | Status: DC | PRN

## 2021-08-05 NOTE — Discharge Instructions (Signed)
You can take the zofran as needed for nausea and vomiting.   You can take Tylenol and/or Ibuprofen as needed for pain relief and fever reduction.   Try a BRAT (bananas, rice, applesause, toast) or bland food diet for the next few days.  Stick with foods that are gentle on your stomach.  Avoid foods that are difficult to digest, such as diary.  As you feel better, you can start eating like you do normally.   You can use ginger (ginger ale, ginger candy) and mint for nausea.    Make sure you are drinking plenty of fluids, such as water, powerade/gatorade, pedialyte, juices, and teas.    Return or go to the Emergency Department if symptoms worsen or do not improve in the next few days.

## 2021-08-05 NOTE — ED Triage Notes (Signed)
Pt c/o abd pains with nausea for 2 days. Denies vomiting, urinary problems or bowel problems.
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
## 2022-05-02 ENCOUNTER — Ambulatory Visit: Payer: No Typology Code available for payment source | Admitting: Podiatry

## 2022-05-02 ENCOUNTER — Encounter: Payer: Self-pay | Admitting: Podiatry

## 2022-05-02 DIAGNOSIS — Q828 Other specified congenital malformations of skin: Secondary | ICD-10-CM | POA: Diagnosis not present

## 2022-05-07 NOTE — Progress Notes (Signed)
  Subjective:  Patient ID: Angel Oliver, male    DOB: [birth year],  MRN: [id number]  Chief Complaint  Patient presents with   Toe Pain    Left foot pinky toe pain, the patient states that he has had toe pain for 6-7 months, no injury.    31 y.o. male presents with the above complaint.  Patient presents with complaint of left submetatarsal 5 porokeratotic lesion.  Patient states painful to touch.  Is been going on for 6 to 7 months.  It has progressively gotten worse.  He wanted get it evaluated hurts with ambulation and hurts with pressure he has not seen anyone else prior to seeing me.  He denies any other acute complaints.  No injury   Review of Systems: Negative except as noted in the HPI. Denies N/V/F/Ch.  No past medical history on file.  Current Outpatient Medications:    ibuprofen (ADVIL) 600 MG tablet, Take 1 tablet (600 mg total) by mouth every 6 (six) hours as needed., Disp: 30 tablet, Rfl: 0   ondansetron (ZOFRAN ODT) 4 MG disintegrating tablet, Take 1 tablet (4 mg total) by mouth every 8 (eight) hours as needed for nausea or vomiting. (Patient not taking: Reported on 05/02/2022), Disp: 20 tablet, Rfl: 0  Social History   Tobacco Use  Smoking Status Every Day   Packs/day: 0.25   Types: Cigarettes  Smokeless Tobacco Never    No Known Allergies Objective:  There were no vitals filed for this visit. There is no height or weight on file to calculate BMI. Constitutional Well developed. Well nourished.  Vascular Dorsalis pedis pulses palpable bilaterally. Posterior tibial pulses palpable bilaterally. Capillary refill normal to all digits.  No cyanosis or clubbing noted. Pedal hair growth normal.  Neurologic Normal speech. Oriented to person, place, and time. Epicritic sensation to light touch grossly present bilaterally.  Dermatologic Hyperkeratotic lesion with central nucleated core noted to left submetatarsal 5.  Pain on palpation to the lesion.  Orthopedic: Normal  joint ROM without pain or crepitus bilaterally. No visible deformities. No bony tenderness.   Radiographs: None Assessment:   1. Porokeratosis    Plan:  Patient was evaluated and treated and all questions answered.  Left submet 5 porokeratosis -All questions and concerns were discussed with the patient in extensive detail given the amount of pain that he is experiencing benefit from debridement of the lesion using chisel blade handle the lesion was debrided down to healthy striated tissue.  No complication noted no pinpoint bleeding noted. -A steroid injection was performed at left submetatarsal 5 using 1% plain Lidocaine and 10 mg of Kenalog. This was well tolerated.   No follow-ups on file.

## 2023-11-26 ENCOUNTER — Ambulatory Visit (HOSPITAL_COMMUNITY)
Admission: EM | Admit: 2023-11-26 | Discharge: 2023-11-26 | Disposition: A | Discharge status: Home / Self Care | Payer: Self-pay | Attending: Internal Medicine | Admitting: Internal Medicine

## 2023-11-26 ENCOUNTER — Encounter (HOSPITAL_COMMUNITY): Payer: Self-pay

## 2023-11-26 DIAGNOSIS — J069 Acute upper respiratory infection, unspecified: Secondary | ICD-10-CM

## 2023-11-26 DIAGNOSIS — R109 Unspecified abdominal pain: Secondary | ICD-10-CM

## 2023-11-26 LAB — POCT URINALYSIS DIP (MANUAL ENTRY)
Bilirubin, UA: NEGATIVE
Blood, UA: NEGATIVE
Glucose, UA: NEGATIVE mg/dL
Ketones, POC UA: NEGATIVE mg/dL
Leukocytes, UA: NEGATIVE
Nitrite, UA: NEGATIVE
Protein Ur, POC: NEGATIVE mg/dL
Spec Grav, UA: 1.015 (ref 1.010–1.025)
Urobilinogen, UA: 0.2 U/dL
pH, UA: 7 (ref 5.0–8.0)

## 2023-11-26 LAB — POC COVID19/FLU A&B COMBO
Covid Antigen, POC: NEGATIVE
Influenza A Antigen, POC: NEGATIVE
Influenza B Antigen, POC: NEGATIVE

## 2023-11-26 MED ORDER — BENZONATATE 200 MG PO CAPS: 200.0000 mg | ORAL_CAPSULE | Freq: Three times a day (TID) | ORAL | 0 refills | Status: AC | PRN

## 2023-11-26 MED ORDER — MELOXICAM 7.5 MG PO TABS: 7.5000 mg | ORAL_TABLET | Freq: Two times a day (BID) | ORAL | 0 refills | Status: AC

## 2023-11-26 NOTE — Discharge Instructions (Signed)
Your flu, covid and urine test area negative Lets get you to try medication for the side pain which may be muscular and should help. But if not please follow up with a primary care doctor.

## 2023-11-26 NOTE — ED Triage Notes (Signed)
Pt c/o LLQ/side pain intermittent for 2wks. States has a NBM everyday.  Pt c/o dry cough, runny nose, and feeling tired x2 days. Requesting covid and flu test. States took cough syrup with some relief.
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
## 2023-12-08 ENCOUNTER — Encounter: Payer: No Typology Code available for payment source | Admitting: Family

## 2023-12-08 NOTE — Progress Notes (Signed)
   This encounter was created in error - please disregard. No show

## 2024-03-16 ENCOUNTER — Emergency Department (HOSPITAL_COMMUNITY)
Admission: EM | Admit: 2024-03-16 | Discharge: 2024-03-16 | Disposition: A | Discharge status: Home / Self Care | Payer: Self-pay | Attending: Emergency Medicine | Admitting: Emergency Medicine

## 2024-03-16 ENCOUNTER — Encounter (HOSPITAL_COMMUNITY): Payer: Self-pay | Admitting: Emergency Medicine

## 2024-03-16 ENCOUNTER — Other Ambulatory Visit: Payer: Self-pay

## 2024-03-16 DIAGNOSIS — R1084 Generalized abdominal pain: Secondary | ICD-10-CM | POA: Insufficient documentation

## 2024-03-16 DIAGNOSIS — R0789 Other chest pain: Secondary | ICD-10-CM | POA: Insufficient documentation

## 2024-03-16 LAB — CBC
HCT: 53 % — ABNORMAL HIGH (ref 39.0–52.0)
Hemoglobin: 17.5 g/dL — ABNORMAL HIGH (ref 13.0–17.0)
MCH: 30 pg (ref 26.0–34.0)
MCHC: 33 g/dL (ref 30.0–36.0)
MCV: 90.8 fL (ref 80.0–100.0)
Platelets: 251 10*3/uL (ref 150–400)
RBC: 5.84 MIL/uL — ABNORMAL HIGH (ref 4.22–5.81)
RDW: 12.7 % (ref 11.5–15.5)
WBC: 3.6 10*3/uL — ABNORMAL LOW (ref 4.0–10.5)
nRBC: 0 % (ref 0.0–0.2)

## 2024-03-16 LAB — URINALYSIS, ROUTINE W REFLEX MICROSCOPIC
Glucose, UA: NEGATIVE mg/dL
Hgb urine dipstick: NEGATIVE
Ketones, ur: NEGATIVE mg/dL
Leukocytes,Ua: NEGATIVE
Nitrite: NEGATIVE
Protein, ur: 30 mg/dL — AB
Specific Gravity, Urine: 1.025 (ref 1.005–1.030)
pH: 6.5 (ref 5.0–8.0)

## 2024-03-16 LAB — COMPREHENSIVE METABOLIC PANEL WITH GFR
ALT: 35 U/L (ref 0–44)
AST: 36 U/L (ref 15–41)
Albumin: 3.9 g/dL (ref 3.5–5.0)
Alkaline Phosphatase: 56 U/L (ref 38–126)
Anion gap: 10 (ref 5–15)
BUN: 5 mg/dL — ABNORMAL LOW (ref 6–20)
CO2: 24 mmol/L (ref 22–32)
Calcium: 9.4 mg/dL (ref 8.9–10.3)
Chloride: 105 mmol/L (ref 98–111)
Creatinine, Ser: 0.77 mg/dL (ref 0.61–1.24)
GFR, Estimated: >60 mL/min (ref >60)
Glucose, Bld: 118 mg/dL — ABNORMAL HIGH (ref 70–99)
Potassium: 3.7 mmol/L (ref 3.5–5.1)
Sodium: 139 mmol/L (ref 135–145)
Total Bilirubin: 0.5 mg/dL (ref 0.0–1.2)
Total Protein: 7 g/dL (ref 6.5–8.1)

## 2024-03-16 LAB — URINALYSIS, MICROSCOPIC (REFLEX)

## 2024-03-16 LAB — LIPASE, BLOOD: Lipase: 33 U/L (ref 11–51)

## 2024-03-16 MED ORDER — IBUPROFEN 400 MG PO TABS
400.0000 mg | ORAL_TABLET | Freq: Once | ORAL | Status: AC
  Administered 2024-03-16: 400 mg via ORAL
  Filled 2024-03-16: qty 1

## 2024-03-16 MED ORDER — ACETAMINOPHEN 500 MG PO TABS
1000.0000 mg | ORAL_TABLET | Freq: Once | ORAL | Status: AC
  Administered 2024-03-16: 1000 mg via ORAL
  Filled 2024-03-16: qty 2

## 2024-03-16 NOTE — ED Triage Notes (Signed)
 Pt complains of all over abd pain on and off x 1 month. Denies any n/v/d. Wants to make sure his organs are ok.

## 2024-03-16 NOTE — ED Notes (Signed)
 The pt is alert c/o abd pain

## 2024-03-16 NOTE — ED Provider Triage Note (Signed)
 Emergency Medicine Provider Triage Evaluation Note  Angel Oliver , a 33 y.o. male  was evaluated in triage.  Pt complains of abdominal pain.  Pt worried about his kidneys and liver  Review of Systems  Positive: Abdominal pain Negative: fever  Physical Exam  BP 127/80   Pulse 81   Temp 98.7 F (37.1 C) (Oral)   Resp 18   Wt 73 kg   SpO2 100%   BMI 23.09 kg/m  Gen:   Awake, no distress   Resp:  Normal effort  MSK:   Moves extremities without difficulty  Other:    Medical Decision Making  Medically screening exam initiated at 4:55 PM.  Appropriate orders placed.  Angel Oliver was informed that the remainder of the evaluation will be completed by another provider, this initial triage assessment does not replace that evaluation, and the importance of remaining in the ED until their evaluation is complete.     Sandi Crosby, PA-C 03/16/24 1656

## 2024-03-16 NOTE — Discharge Instructions (Addendum)
 Please take tylenol  500mg  every 4 hours. You make take 100mg  at a time but DO NOT TAKE more than 4000mg  in a 24hr period. Please take 400mg  Ibuprofen  every 4 hours  Please call [facility] to schedule appointment with your primary care doctor.  Go to the family medicine center information is listed above in your paperwork. Thank you for allowing us  to take care of you today.  We hope you begin feeling better soon.   To-Do:  Please follow-up with your primary doctor within the next 2-3 days. Please return to the Emergency Department or call 911 if you experience chest pain, shortness of breath, severe pain, severe fever, altered mental status, or have any reason to think that you need emergency medical care.  Thank you again.  Hope you feel better soon.  [facility] of Emergency Medicine

## 2024-03-16 NOTE — ED Provider Notes (Signed)
 [place] EMERGENCY DEPARTMENT AT [facility] Provider Note   CSN: [id number] Arrival date & time: 03/16/24  1502     History Chief Complaint  Patient presents with   Abdominal Pain    Angel Oliver is a 33 y.o. male with no significant past medical history who presents to the ED for multiple complaints.  Patient states he does not have a regular doctor and would like to have his "organs checked".  Patient states he has intermittent wandering abdominal pain.  Patient states he does have burning after eating frequently however also has scattered abdominal pain occasionally.  Patient states abdominal pain usually resolves after having a bowel movement.  Patient reports last bowel movement earlier today.  Patient also reports chest pain and back pain when moving around and especially when moving his arm.  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  Patient denies any shortness of breath fevers chills cough congestion nausea vomiting diarrhea constipation urinary changes.  No concerns for STIs or STDs.  Patient does not take anything for pain.  Patient request referral to primary care doctor       Home Medications Prior to Admission medications   Medication Sig Start Date End Date Taking? Authorizing Provider  benzonatate  (TESSALON ) 200 MG capsule Take 1 capsule (200 mg total) by mouth 3 (three) times daily as needed for cough. 11/26/23   Rodriguez-Southworth, Sylvia, PA-C  meloxicam  (MOBIC ) 7.5 MG tablet Take 1 tablet (7.5 mg total) by mouth 2 (two) times daily. 11/26/23   Rodriguez-Southworth, Sylvia, PA-C      Allergies    Patient has no known allergies.    Review of Systems   Review of Systems  Gastrointestinal:  Positive for abdominal pain.  See HPI  Physical Exam Updated Vital Signs BP 128/84   Pulse 70   Temp (!) 97.5 F (36.4 C) (Oral)   Resp 15   Wt 73 kg   SpO2 100%   BMI 23.09 kg/m  Physical Exam Vitals and nursing note reviewed.  Constitutional:       General: He is not in acute distress.    Appearance: He is well-developed. He is not ill-appearing or diaphoretic.  HENT:     Head: Normocephalic and atraumatic.  Eyes:     Conjunctiva/sclera: Conjunctivae normal.  Cardiovascular:     Rate and Rhythm: Normal rate and regular rhythm.     Heart sounds: No murmur heard. Pulmonary:     Effort: Pulmonary effort is normal. No respiratory distress.     Breath sounds: Normal breath sounds.  Abdominal:     General: Abdomen is flat.     Palpations: Abdomen is soft.     Tenderness: There is no abdominal tenderness. There is no right CVA tenderness, left CVA tenderness, guarding or rebound. Negative signs include Murphy's sign, Rovsing's sign and McBurney's sign.     Hernia: No hernia is present.  Musculoskeletal:        General: No swelling.     Cervical back: Neck supple.     Comments: Patient has tenderness with palpation over left anterior chest left lateral ribs and inferior to left trapezius.  No crepitus.  No rashes.  No bruising.  Skin:    General: Skin is warm and dry.     Capillary Refill: Capillary refill takes less than 2 seconds.  Neurological:     Mental Status: He is alert.  Psychiatric:        Mood and Affect: Mood normal.  ED Results / Procedures / Treatments   Labs (all labs ordered are listed, but only abnormal results are displayed) Labs Reviewed  COMPREHENSIVE METABOLIC PANEL WITH GFR - Abnormal; Notable for the following components:      Result Value   Glucose, Bld 118 (*)    BUN 5 (*)    All other components within normal limits  CBC - Abnormal; Notable for the following components:   WBC 3.6 (*)    RBC 5.84 (*)    Hemoglobin 17.5 (*)    HCT 53.0 (*)    All other components within normal limits  URINALYSIS, ROUTINE W REFLEX MICROSCOPIC - Abnormal; Notable for the following components:   Color, Urine AMBER (*)    Bilirubin Urine SMALL (*)    Protein, ur 30 (*)    All other components within normal  limits  URINALYSIS, MICROSCOPIC (REFLEX) - Abnormal; Notable for the following components:   Bacteria, UA RARE (*)    All other components within normal limits  LIPASE, BLOOD    EKG EKG Interpretation Date/Time:  Wednesday March 16 2024 19:55:29 EDT Ventricular Rate:  69 PR Interval:  196 QRS Duration:  87 QT Interval:  364 QTC Calculation: 390 R Axis:   83  Text Interpretation: Sinus rhythm ST elev, probable normal early repol pattern Confirmed by Hiawatha Lout ([id number]) on 03/16/2024 8:36:31 PM   Medications Ordered in ED Medications  acetaminophen  (TYLENOL ) tablet 1,000 mg (1,000 mg Oral Given 03/16/24 2003)  ibuprofen  (ADVIL ) tablet 400 mg (400 mg Oral Given 03/16/24 2002)    ED Course/ Medical Decision Making/ A&P   {   Angel Oliver is a 33 y.o. male with no significant past medical history who presents to the ED for multiple complaints.  Patient states he does not have a regular doctor and would like to have his "organs checked" as above.  Differential includes pancreatitis, GERD, musculoskeletal pain, AKI, cholecystitis, appendicitis, urinary tract infection  No evidence of urinary tract infection.  Patient denies any concerns for STIs or STDs.  Lipase within normal limits.  No tenderness in epigastric region.  No evidence of AKI or significant electrolyte abnormalities.  Patient received Tylenol  ibuprofen  for pain. Overall low suspicion for acute intra-abdominal abnormality.  Imaging not indicated at this time.  Exam and history most consistent with musculoskeletal pain.  Patient referred to primary care physician.  Advised to continue over-the-counter Tylenol  ibuprofen  as needed for pain.  Advised to follow-up with PCP.  Return precautions provided.  Patient stable for discharge outpatient follow-up Final Clinical Impression(s) / ED Diagnoses Final diagnoses:  Musculoskeletal chest pain  Generalized abdominal pain        Angel Keller, DO 03/16/24 2332     Angel Applebaum, MD 03/17/24 [phone]

## 2024-08-15 ENCOUNTER — Emergency Department (HOSPITAL_COMMUNITY)

## 2024-08-15 ENCOUNTER — Encounter (HOSPITAL_COMMUNITY): Payer: Self-pay

## 2024-08-15 ENCOUNTER — Other Ambulatory Visit: Payer: Self-pay

## 2024-08-15 ENCOUNTER — Emergency Department (HOSPITAL_COMMUNITY)
Admission: EM | Admit: 2024-08-15 | Discharge: 2024-08-15 | Disposition: A | Discharge status: Home / Self Care | Attending: Emergency Medicine | Admitting: Emergency Medicine

## 2024-08-15 DIAGNOSIS — M549 Dorsalgia, unspecified: Secondary | ICD-10-CM | POA: Diagnosis not present

## 2024-08-15 DIAGNOSIS — Y92410 Unspecified street and highway as the place of occurrence of the external cause: Secondary | ICD-10-CM | POA: Diagnosis not present

## 2024-08-15 DIAGNOSIS — M542 Cervicalgia: Secondary | ICD-10-CM | POA: Insufficient documentation

## 2024-08-15 DIAGNOSIS — R0781 Pleurodynia: Secondary | ICD-10-CM | POA: Insufficient documentation

## 2024-08-15 MED ORDER — IBUPROFEN 800 MG PO TABS: 800.0000 mg | ORAL_TABLET | Freq: Three times a day (TID) | ORAL | 0 refills | Status: AC

## 2024-08-15 MED ORDER — METHOCARBAMOL 500 MG PO TABS
500.0000 mg | ORAL_TABLET | Freq: Once | ORAL | Status: AC
  Administered 2024-08-15: 500 mg via ORAL
  Filled 2024-08-15: qty 1

## 2024-08-15 MED ORDER — METHOCARBAMOL 500 MG PO TABS: 500.0000 mg | ORAL_TABLET | Freq: Two times a day (BID) | ORAL | 0 refills | Status: AC

## 2024-08-15 MED ORDER — KETOROLAC TROMETHAMINE 15 MG/ML IJ SOLN
15.0000 mg | Freq: Once | INTRAMUSCULAR | Status: AC
  Administered 2024-08-15: 15 mg via INTRAMUSCULAR
  Filled 2024-08-15: qty 1
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
## 2024-08-15 NOTE — ED Triage Notes (Signed)
 Pt currently c.o thoracic to lumbar spine pain , bilateral hip pain and bilateral rib cage pain.

## 2024-10-05 ENCOUNTER — Ambulatory Visit (HOSPITAL_COMMUNITY)
Admission: EM | Admit: 2024-10-05 | Discharge: 2024-10-05 | Disposition: A | Discharge status: Home / Self Care | Payer: Self-pay

## 2024-10-05 ENCOUNTER — Encounter (HOSPITAL_COMMUNITY): Payer: Self-pay

## 2024-10-05 DIAGNOSIS — R197 Diarrhea, unspecified: Secondary | ICD-10-CM | POA: Insufficient documentation

## 2024-10-05 LAB — CBC WITH DIFFERENTIAL/PLATELET
Abs Immature Granulocytes: 0 K/uL (ref 0.00–0.07)
Basophils Absolute: 0.1 K/uL (ref 0.0–0.1)
Basophils Relative: 2 %
Eosinophils Absolute: 0.1 K/uL (ref 0.0–0.5)
Eosinophils Relative: 3 %
HCT: 44.8 % (ref 39.0–52.0)
Hemoglobin: 14.7 g/dL (ref 13.0–17.0)
Immature Granulocytes: 0 %
Lymphocytes Relative: 23 %
Lymphs Abs: 1.1 K/uL (ref 0.7–4.0)
MCH: 29.5 pg (ref 26.0–34.0)
MCHC: 32.8 g/dL (ref 30.0–36.0)
MCV: 90 fL (ref 80.0–100.0)
Monocytes Absolute: 0.5 K/uL (ref 0.1–1.0)
Monocytes Relative: 12 %
Neutro Abs: 2.7 K/uL (ref 1.7–7.7)
Neutrophils Relative %: 60 %
Platelets: 269 K/uL (ref 150–400)
RBC: 4.98 MIL/uL (ref 4.22–5.81)
RDW: 12.6 % (ref 11.5–15.5)
WBC: 4.5 K/uL (ref 4.0–10.5)
nRBC: 0 % (ref 0.0–0.2)

## 2024-10-05 LAB — COMPREHENSIVE METABOLIC PANEL WITH GFR
ALT: 61 U/L — ABNORMAL HIGH (ref 0–44)
AST: 41 U/L (ref 15–41)
Albumin: 4.8 g/dL (ref 3.5–5.0)
Alkaline Phosphatase: 81 U/L (ref 38–126)
Anion gap: 10 (ref 5–15)
BUN: 10 mg/dL (ref 6–20)
CO2: 25 mmol/L (ref 22–32)
Calcium: 10.1 mg/dL (ref 8.9–10.3)
Chloride: 101 mmol/L (ref 98–111)
Creatinine, Ser: 0.71 mg/dL (ref 0.61–1.24)
GFR, Estimated: >60 mL/min
Glucose, Bld: 90 mg/dL (ref 70–99)
Potassium: 3.7 mmol/L (ref 3.5–5.1)
Sodium: 136 mmol/L (ref 135–145)
Total Bilirubin: 0.4 mg/dL (ref 0.0–1.2)
Total Protein: 7.4 g/dL (ref 6.5–8.1)

## 2024-10-05 LAB — LIPASE, BLOOD: Lipase: 33 U/L (ref 11–51)

## 2024-10-05 MED ORDER — DICYCLOMINE HCL 10 MG PO CAPS: 10.0000 mg | ORAL_CAPSULE | Freq: Three times a day (TID) | ORAL | 0 refills | Status: AC

## 2024-10-05 NOTE — Discharge Instructions (Addendum)
 We have collected your labs.  They will be sent out.  You will be notified via telephone for any abnormalities.    You have been prescribed Bentyl .  Take 1 tablet 3 times a day before meals.  Avoid alcohol. Increase water intake. Avoid milk as this can make stomach cramps worse.  If you develop any new or worsening symptoms or if your symptoms do not start to improve, please return here or follow-up with your primary care provider.  If your symptoms are severe, please go to the emergency room.
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
## 2024-10-05 NOTE — ED Triage Notes (Signed)
 Patient reports that he has had left abdominal pain and diarrhea x 7 days. Patient denies taking any medication for his symptoms.

## 2024-10-07 ENCOUNTER — Ambulatory Visit (HOSPITAL_COMMUNITY): Payer: Self-pay

## 2024-10-19 ENCOUNTER — Ambulatory Visit: Payer: Self-pay | Admitting: Family Medicine

## 2024-10-19 ENCOUNTER — Encounter: Payer: Self-pay | Admitting: Family Medicine

## 2024-10-19 ENCOUNTER — Ambulatory Visit

## 2024-10-19 VITALS — BP 132/70 | HR 69 | Temp 98.0°F | Ht 70.0 in | Wt 170.0 lb

## 2024-10-19 DIAGNOSIS — Z7689 Persons encountering health services in other specified circumstances: Secondary | ICD-10-CM

## 2024-10-19 DIAGNOSIS — R1032 Left lower quadrant pain: Secondary | ICD-10-CM | POA: Diagnosis not present

## 2024-10-19 DIAGNOSIS — K5900 Constipation, unspecified: Secondary | ICD-10-CM

## 2024-10-19 MED ORDER — POLYETHYLENE GLYCOL 3350 17 GM/SCOOP PO POWD: 17.0000 g | Freq: Every day | ORAL | 1 refills | Status: AC

## 2024-10-19 NOTE — Patient Instructions (Signed)
 [facility]  [address]  The radiology department is on the first floor which is best accessed by going around to the back of the building. No appointment necessary. You can go at your convenience.

## 2024-10-19 NOTE — Progress Notes (Signed)
 "  New Patient Office Visit  Subjective    Patient ID: Angel Oliver, male    DOB: August 22, 1991  Age: 34 y.o. MRN: [id number]  CC:  Chief Complaint  Patient presents with   Abdominal Pain    Left side abdominal pain x 2 weeks. Feels like its pulsating.    HPI Angel Oliver presents to establish care with this practice. He is new to me.  Went to urgent care on 12/24 with diarrhea, was told to stop drinking milk. Now he is having hard stools and does not feel like he is emptying his bowels. Symptoms started one week ago after stopping the milk.  Reports stopping alcohol and started drinking milk instead. Last normal BM 3 weeks. Pain on left side of abdomen.  Chart review: 10/05/24 CMP with slight elevation in ALT CBC normal     Outpatient Encounter Medications as of 10/19/2024  Medication Sig   benzonatate  (TESSALON ) 200 MG capsule Take 1 capsule (200 mg total) by mouth 3 (three) times daily as needed for cough.   dicyclomine  (BENTYL ) 10 MG capsule Take 1 capsule (10 mg total) by mouth 4 (four) times daily -  before meals and at bedtime.   meloxicam  (MOBIC ) 7.5 MG tablet Take 1 tablet (7.5 mg total) by mouth 2 (two) times daily.   methocarbamol  (ROBAXIN ) 500 MG tablet Take 1 tablet (500 mg total) by mouth 2 (two) times daily.   polyethylene glycol powder (GLYCOLAX /MIRALAX ) 17 GM/SCOOP powder Take 17 g by mouth daily. Dissolve 1 capful (17g) in 4-8 ounces of liquid and take by mouth daily.   ibuprofen  (ADVIL ) 800 MG tablet Take 1 tablet (800 mg total) by mouth 3 (three) times daily. (Patient not taking: Reported on 10/19/2024)   No facility-administered encounter medications on file as of 10/19/2024.    History reviewed. No pertinent past medical history.  History reviewed. No pertinent surgical history.  History reviewed. No pertinent family history.  Social History   Socioeconomic History   Marital status: Single    Spouse name: Not on file   Number of children: Not on  file   Years of education: Not on file   Highest education level: Not on file  Occupational History   Not on file  Tobacco Use   Smoking status: Every Day    Current packs/day: 0.25    Types: Cigarettes   Smokeless tobacco: Never  Vaping Use   Vaping status: Never Used  Substance and Sexual Activity   Alcohol use: Not Currently    Alcohol/week: 4.0 standard drinks of alcohol    Types: 4 Cans of beer per week   Drug use: No   Sexual activity: Not on file  Other Topics Concern   Not on file  Social History Narrative   Not on file   Social Drivers of Health   Tobacco Use: High Risk (10/19/2024)   Patient History    Smoking Tobacco Use: Every Day    Smokeless Tobacco Use: Never    Passive Exposure: Not on file  Financial Resource Strain: Not on file  Food Insecurity: Not on file  Transportation Needs: Not on file  Physical Activity: Not on file  Stress: Not on file  Social Connections: Not on file  Intimate Partner Violence: Not on file  Depression (PHQ2-9): Low Risk (10/19/2024)   Depression (PHQ2-9)    PHQ-2 Score: 0  Alcohol Screen: Not on file  Housing: Not on file  Utilities: Not on file  Health Literacy: Not on  file    ROS      Objective    BP 132/70 (BP Location: Left Arm, Patient Position: Sitting, Cuff Size: Large)   Pulse 69   Temp 98 F (36.7 C) (Oral)   Ht 5' 10 (1.778 m)   Wt 170 lb (77.1 kg)   SpO2 99%   BMI 24.39 kg/m   Physical Exam Vitals and nursing note reviewed.  Constitutional:      General: He is not in acute distress.    Appearance: Normal appearance.  Cardiovascular:     Rate and Rhythm: Regular rhythm.     Heart sounds: Normal heart sounds.  Pulmonary:     Effort: Pulmonary effort is normal.     Breath sounds: Normal breath sounds.  Abdominal:     General: There is distension.     Palpations: Abdomen is soft.     Tenderness: There is no abdominal tenderness.  Skin:    General: Skin is warm and dry.  Neurological:      General: No focal deficit present.     Mental Status: He is alert. Mental status is at baseline.  Psychiatric:        Mood and Affect: Mood normal.        Behavior: Behavior normal.        Thought Content: Thought content normal.        Judgment: Judgment normal.         Assessment & Plan:   Problem List Items Addressed This Visit     Establishing care with new doctor, encounter for - Primary   Constipation   Last normal BM 3 weeks ago. Instructed to stop drinking milk that was causing diarrhea. Since then he has been constipated with left lateral side discomfort. There is no significant tenderness upon exam today. Labs on 12/24 normal, which mild elevation in ALT. Endorses stopping alcohol 3 weeks ago as well. Abdominal x-ray today to confirm constipation. Miralax  daily with goal of normal, soft BM daily. If symptoms do not resolve with daily BMs  return  for further evaluation.       Relevant Medications   polyethylene glycol powder (GLYCOLAX /MIRALAX ) 17 GM/SCOOP powder   Other Relevant Orders   DG Abd 1 View  Agrees with plan of care discussed.  Questions answered.   Return if symptoms worsen or fail to improve.   Darice JONELLE Brownie, FNP   "

## 2024-10-19 NOTE — Assessment & Plan Note (Addendum)
 Last normal BM 3 weeks ago. Instructed to stop drinking milk that was causing diarrhea. Since then he has been constipated with left lateral side discomfort. There is no significant tenderness upon exam today. Labs on 12/24 normal, which mild elevation in ALT. Endorses stopping alcohol 3 weeks ago as well. Abdominal x-ray today to confirm constipation. Miralax  daily with goal of normal, soft BM daily. Increase water rich foods and drink adequate water.  If symptoms do not resolve with daily BMs  return  for further evaluation. May benefit from GI referral if symptoms do not resolve.
